# Patient Record
Sex: Female | Born: 1955 | Race: White | Hispanic: No | Marital: Married | Smoking: Former smoker
Health system: Southern US, Community
[De-identification: ages and names within clinical notes are randomized; demographics above are authoritative.]

## PROBLEM LIST (undated history)

## (undated) DIAGNOSIS — E119 Type 2 diabetes mellitus without complications: Secondary | ICD-10-CM

## (undated) DIAGNOSIS — E785 Hyperlipidemia, unspecified: Secondary | ICD-10-CM

## (undated) DIAGNOSIS — I10 Essential (primary) hypertension: Secondary | ICD-10-CM

## (undated) HISTORY — PX: TUBAL LIGATION: SHX77

## (undated) HISTORY — DX: Essential (primary) hypertension: I10

## (undated) HISTORY — DX: Hyperlipidemia, unspecified: E78.5

## (undated) HISTORY — PX: CATARACT EXTRACTION, BILATERAL: SHX1313

## (undated) HISTORY — DX: Type 2 diabetes mellitus without complications: E11.9

## (undated) HISTORY — PX: TONSILLECTOMY: SUR1361

---

## 1999-09-05 ENCOUNTER — Other Ambulatory Visit: Admission: RE | Admit: 1999-09-05 | Discharge: 1999-09-05 | Payer: Self-pay | Admitting: *Deleted

## 2001-01-18 ENCOUNTER — Other Ambulatory Visit: Admission: RE | Admit: 2001-01-18 | Discharge: 2001-01-18 | Payer: Self-pay | Admitting: *Deleted

## 2002-04-04 ENCOUNTER — Other Ambulatory Visit: Admission: RE | Admit: 2002-04-04 | Discharge: 2002-04-04 | Payer: Self-pay | Admitting: Internal Medicine

## 2004-04-03 ENCOUNTER — Encounter: Admission: RE | Admit: 2004-04-03 | Discharge: 2004-04-03 | Payer: Self-pay | Admitting: Internal Medicine

## 2005-02-09 ENCOUNTER — Other Ambulatory Visit: Admission: RE | Admit: 2005-02-09 | Discharge: 2005-02-09 | Payer: Self-pay | Admitting: Endocrinology

## 2006-04-02 ENCOUNTER — Encounter: Admission: RE | Admit: 2006-04-02 | Discharge: 2006-04-02 | Payer: Self-pay | Admitting: Rheumatology

## 2006-11-24 ENCOUNTER — Ambulatory Visit (HOSPITAL_COMMUNITY): Admission: RE | Admit: 2006-11-24 | Discharge: 2006-11-24 | Payer: Self-pay | Admitting: Family Medicine

## 2007-04-29 ENCOUNTER — Encounter: Admission: RE | Admit: 2007-04-29 | Discharge: 2007-04-29 | Payer: Self-pay | Admitting: Specialist

## 2007-07-25 ENCOUNTER — Encounter: Admission: RE | Admit: 2007-07-25 | Discharge: 2007-07-25 | Payer: Self-pay | Admitting: Specialist

## 2007-11-24 ENCOUNTER — Other Ambulatory Visit: Admission: RE | Admit: 2007-11-24 | Discharge: 2007-11-24 | Payer: Self-pay | Admitting: Family Medicine

## 2009-04-10 ENCOUNTER — Other Ambulatory Visit: Admission: RE | Admit: 2009-04-10 | Discharge: 2009-04-10 | Payer: Self-pay | Admitting: Family Medicine

## 2010-04-06 ENCOUNTER — Encounter: Payer: Self-pay | Admitting: General Surgery

## 2012-05-12 ENCOUNTER — Other Ambulatory Visit: Payer: Self-pay

## 2012-06-10 ENCOUNTER — Ambulatory Visit
Admission: RE | Admit: 2012-06-10 | Discharge: 2012-06-10 | Disposition: A | Discharge status: Home / Self Care | Payer: Self-pay | Source: Ambulatory Visit

## 2012-06-10 DIAGNOSIS — Z1231 Encounter for screening mammogram for malignant neoplasm of breast: Secondary | ICD-10-CM

## 2012-12-08 ENCOUNTER — Other Ambulatory Visit (HOSPITAL_COMMUNITY)
Admission: RE | Admit: 2012-12-08 | Discharge: 2012-12-08 | Disposition: A | Discharge status: Home / Self Care | Payer: BC Managed Care – PPO | Source: Ambulatory Visit | Attending: Family Medicine | Admitting: Family Medicine

## 2012-12-08 ENCOUNTER — Other Ambulatory Visit: Payer: Self-pay | Admitting: Family Medicine

## 2012-12-08 DIAGNOSIS — Z0000 Encounter for general adult medical examination without abnormal findings: Secondary | ICD-10-CM | POA: Insufficient documentation

## 2015-05-02 ENCOUNTER — Other Ambulatory Visit: Payer: Self-pay | Admitting: Obstetrics & Gynecology

## 2015-05-02 ENCOUNTER — Other Ambulatory Visit (HOSPITAL_COMMUNITY)
Admission: RE | Admit: 2015-05-02 | Discharge: 2015-05-02 | Disposition: A | Discharge status: Home / Self Care | Payer: BLUE CROSS/BLUE SHIELD | Source: Ambulatory Visit | Attending: Obstetrics & Gynecology | Admitting: Obstetrics & Gynecology

## 2015-05-02 DIAGNOSIS — Z1151 Encounter for screening for human papillomavirus (HPV): Secondary | ICD-10-CM | POA: Insufficient documentation

## 2015-05-02 DIAGNOSIS — Z01419 Encounter for gynecological examination (general) (routine) without abnormal findings: Secondary | ICD-10-CM | POA: Diagnosis present

## 2015-05-06 LAB — CYTOLOGY - PAP

## 2019-01-19 DIAGNOSIS — Z6825 Body mass index (BMI) 25.0-25.9, adult: Secondary | ICD-10-CM | POA: Diagnosis not present

## 2019-01-19 DIAGNOSIS — R202 Paresthesia of skin: Secondary | ICD-10-CM | POA: Diagnosis not present

## 2019-01-19 DIAGNOSIS — Z0000 Encounter for general adult medical examination without abnormal findings: Secondary | ICD-10-CM | POA: Diagnosis not present

## 2019-01-19 DIAGNOSIS — M2550 Pain in unspecified joint: Secondary | ICD-10-CM | POA: Diagnosis not present

## 2019-01-19 DIAGNOSIS — I119 Hypertensive heart disease without heart failure: Secondary | ICD-10-CM | POA: Diagnosis not present

## 2019-01-23 DIAGNOSIS — E119 Type 2 diabetes mellitus without complications: Secondary | ICD-10-CM | POA: Diagnosis not present

## 2019-01-29 DIAGNOSIS — Z882 Allergy status to sulfonamides status: Secondary | ICD-10-CM | POA: Diagnosis not present

## 2019-01-29 DIAGNOSIS — R072 Precordial pain: Secondary | ICD-10-CM | POA: Diagnosis not present

## 2019-01-29 DIAGNOSIS — E119 Type 2 diabetes mellitus without complications: Secondary | ICD-10-CM | POA: Diagnosis not present

## 2019-01-29 DIAGNOSIS — R0789 Other chest pain: Secondary | ICD-10-CM | POA: Diagnosis not present

## 2019-01-29 DIAGNOSIS — Z79899 Other long term (current) drug therapy: Secondary | ICD-10-CM | POA: Diagnosis not present

## 2019-01-29 DIAGNOSIS — I10 Essential (primary) hypertension: Secondary | ICD-10-CM | POA: Diagnosis not present

## 2019-01-29 DIAGNOSIS — R0602 Shortness of breath: Secondary | ICD-10-CM | POA: Diagnosis not present

## 2019-01-29 DIAGNOSIS — F17200 Nicotine dependence, unspecified, uncomplicated: Secondary | ICD-10-CM | POA: Diagnosis not present

## 2019-01-29 DIAGNOSIS — R079 Chest pain, unspecified: Secondary | ICD-10-CM | POA: Diagnosis not present

## 2019-02-03 DIAGNOSIS — E119 Type 2 diabetes mellitus without complications: Secondary | ICD-10-CM | POA: Diagnosis not present

## 2019-02-06 DIAGNOSIS — H2513 Age-related nuclear cataract, bilateral: Secondary | ICD-10-CM | POA: Diagnosis not present

## 2019-02-06 DIAGNOSIS — E119 Type 2 diabetes mellitus without complications: Secondary | ICD-10-CM | POA: Diagnosis not present

## 2019-02-06 DIAGNOSIS — Z794 Long term (current) use of insulin: Secondary | ICD-10-CM | POA: Diagnosis not present

## 2019-02-06 DIAGNOSIS — H25013 Cortical age-related cataract, bilateral: Secondary | ICD-10-CM | POA: Diagnosis not present

## 2019-02-08 ENCOUNTER — Other Ambulatory Visit: Payer: Self-pay

## 2019-02-08 ENCOUNTER — Ambulatory Visit: Payer: Self-pay | Admitting: Cardiology

## 2019-02-08 ENCOUNTER — Encounter: Payer: Self-pay | Admitting: Cardiology

## 2019-02-08 ENCOUNTER — Ambulatory Visit (INDEPENDENT_AMBULATORY_CARE_PROVIDER_SITE_OTHER): Payer: BC Managed Care – PPO | Admitting: Cardiology

## 2019-02-08 VITALS — BP 104/61 | HR 71 | Temp 97.5°F | Ht 64.0 in | Wt 133.6 lb

## 2019-02-08 DIAGNOSIS — R072 Precordial pain: Secondary | ICD-10-CM | POA: Diagnosis not present

## 2019-02-08 DIAGNOSIS — R0989 Other specified symptoms and signs involving the circulatory and respiratory systems: Secondary | ICD-10-CM | POA: Insufficient documentation

## 2019-02-08 DIAGNOSIS — F17219 Nicotine dependence, cigarettes, with unspecified nicotine-induced disorders: Secondary | ICD-10-CM | POA: Diagnosis not present

## 2019-02-08 DIAGNOSIS — R0789 Other chest pain: Secondary | ICD-10-CM

## 2019-02-08 DIAGNOSIS — E783 Hyperchylomicronemia: Secondary | ICD-10-CM | POA: Insufficient documentation

## 2019-02-08 DIAGNOSIS — R002 Palpitations: Secondary | ICD-10-CM | POA: Insufficient documentation

## 2019-02-08 DIAGNOSIS — Z720 Tobacco use: Secondary | ICD-10-CM | POA: Diagnosis not present

## 2019-02-08 DIAGNOSIS — I208 Other forms of angina pectoris: Secondary | ICD-10-CM | POA: Insufficient documentation

## 2019-02-08 MED ORDER — CILOSTAZOL 50 MG PO TABS: 50.0000 mg | ORAL_TABLET | Freq: Two times a day (BID) | ORAL | 3 refills | Status: DC

## 2019-02-08 MED ORDER — METOPROLOL TARTRATE 25 MG PO TABS: 25.0000 mg | ORAL_TABLET | ORAL | 1 refills | Status: DC

## 2019-02-08 MED ORDER — ROSUVASTATIN CALCIUM 10 MG PO TABS: 10.0000 mg | ORAL_TABLET | Freq: Every day | ORAL | 3 refills | Status: DC

## 2019-02-08 MED ORDER — METOPROLOL TARTRATE 25 MG PO TABS
25.0000 mg | ORAL_TABLET | ORAL | 1 refills | Status: DC
Start: 2019-02-08 — End: 2019-02-08

## 2019-02-08 NOTE — Progress Notes (Signed)
Patient referred by Jilda Panda, MD for chest pain  Subjective:   Chloe Garza, female    DOB: [birth year], 63 y.o.   MRN: [id number]   Chief Complaint  Patient presents with  . Abnormal ECG  . Palpitations  . New Patient (Initial Visit)     HPI  63 y.o. Caucasian female with tobacco abuse, newly diagnosed uncontrolled type 2 DM, fibromyalgia, referred for evaluation of chest pain and palpitations.   Patient is a Gaffer, currently not working. She was recently diagnosed with diabetes.  She reports episodes of retrosternal chest pain, which are unrelated to exertion.  She also reports episodes of palpitations that seem to occur "all the time". On further questioning, she reports pain in both her legs, especially on walking. She states that it is difficult for her to tease out her pain symptoms given her fibromyalgia.  She currently smokes 1 to 2 cigarettes a day, and hopes to quit soon.   Past Medical History:  Diagnosis Date  . Diabetes mellitus without complication ([place])   . Hypertension      Past Surgical History:  Procedure Laterality Date  . TONSILLECTOMY    . TUBAL LIGATION      Social History   Tobacco Use  Smoking Status Current Some Day Smoker  Smokeless Tobacco Never Used  Tobacco Comment   1-2 per day , since a teenager       Family History  Problem Relation Age of Onset  . Hypertension Mother   . Stroke Mother   . Alzheimer's disease Mother   . Heart attack Father   . Pneumonia Father   . CAD Sister      Current Outpatient Medications on File Prior to Visit  Medication Sig Dispense Refill  . Azilsartan-Chlorthalidone (EDARBYCLOR) 40-25 MG TABS Take 0.5 mg by mouth daily.    . Insulin Glargine, 2 Unit Dial, (TOUJEO MAX SOLOSTAR) 300 UNIT/ML SOPN Inject 20 Units into the skin daily.    . Potassium 99 MG TABS Take 1 tablet by mouth daily.     No current facility-administered medications on file prior to visit.      Cardiovascular studies:  EKG 02/08/2019: Sinus rhythm 72 bpm. Anteolateral T wave inversion, unchanged compared to outside EKG.   Recent labs: 01/20/2019: Glucose 217.  BUN/creatinine 11/0.64.  eGFR normal.  Sodium 137, potassium 3.9.  Rest of the CMP normal. H/H 14/42.  MCV 87.  Platelets 240. Cholesterol 218, triglycerides 293, HDL 44, LDL 114 Hemoglobin A1c 12.1%. ANA screen positive.  Review of Systems  Constitution: Negative for decreased appetite, malaise/fatigue, weight gain and weight loss.  HENT: Negative for congestion.   Eyes: Negative for visual disturbance.  Cardiovascular: Positive for chest pain. Negative for dyspnea on exertion, leg swelling, palpitations and syncope.  Respiratory: Negative for cough.   Endocrine: Negative for cold intolerance.  Hematologic/Lymphatic: Does not bruise/bleed easily.  Skin: Negative for itching and rash.  Musculoskeletal: Negative for myalgias.  Gastrointestinal: Negative for abdominal pain, nausea and vomiting.  Genitourinary: Negative for dysuria.  Neurological: Negative for dizziness and weakness.  Psychiatric/Behavioral: The patient is not nervous/anxious.   All other systems reviewed and are negative.        Vitals:   02/08/19 0855  BP: 104/61  Pulse: 71  Temp: (!) 97.5 F (36.4 C)  SpO2: 95%     Body mass index is 22.93 kg/m. Filed Weights   02/08/19 0855  Weight: 133 lb 9.6 oz (60.6 kg)  Objective:   Physical Exam  Constitutional: She is oriented to person, place, and time. She appears well-developed and well-nourished. No distress.  HENT:  Head: Normocephalic and atraumatic.  Eyes: Pupils are equal, round, and reactive to light. Conjunctivae are normal.  Neck: No JVD present.  Cardiovascular: Normal rate and regular rhythm.  No murmur heard. Pulses:      Femoral pulses are 2+ on the right side and 2+ on the left side.      Popliteal pulses are 1+ on the right side and 0 on the left side.        Dorsalis pedis pulses are 0 on the right side and 0 on the left side.       Posterior tibial pulses are 0 on the right side and 0 on the left side.  Cyanotic appearing  b/l LE toes without any wounds/ulcers.  Pulmonary/Chest: Effort normal and breath sounds normal. She has no wheezes. She has no rales.  Abdominal: Soft. Bowel sounds are normal. There is no rebound.  Musculoskeletal:        General: No edema.  Lymphadenopathy:    She has no cervical adenopathy.  Neurological: She is alert and oriented to person, place, and time. No cranial nerve deficit.  Skin: Skin is warm and dry.  Psychiatric: She has a normal mood and affect.  Nursing note and vitals reviewed.         Assessment & Recommendations:   62 y.o. Caucasian female with tobacco abuse, newly diagnosed uncontrolled type 2 DM, fibromyalgia, referred for evaluation of chest pain and palpitations.   Chest pain: Atypical.  However, she has risk factors with tobacco abuse and uncontrolled type 2 diabetes mellitus.  I will obtain CT coronary angiogram for definitive coronary anatomy evaluation.  Palpitations: Symptoms on a regular basis, sometimes associated with chest pain.  Will obtain echocardiogram to evaluate structural abnormalities, sinus event monitor to evaluate for any arrhythmias.  Suspected peripheral artery disease: Absent extremity pulses with cyanotic extremities, without any ulcer.  I started her on aspirin and Pletal, switch atorvastatin to rosuvastatin 10 mg daily.  I will obtain baseline ABI.  Tobacco cessation counseling:  - Currently smoking 1-2 cigarettes/day   - Patient was informed of the dangers of tobacco abuse including stroke, cancer, and MI, as well as benefits of tobacco cessation. - Patient is willing to quit at this time. - Approximately 5 mins were spent counseling patient cessation techniques. We discussed various methods to help quit smoking, including deciding on a date to quit, joining a  support group, pharmacological agents. Patient would like to quit on her own.. - I will reassess her progress at the next follow-up visit   Continue management of DM as per Dr. Mellody Drown   Thank you for referring the patient to [place]. Please feel free to contact with any questions.  Nigel Mormon, MD [facility] Pager: [phone] Office: [phone]

## 2019-02-08 NOTE — Patient Instructions (Addendum)
Your cardiac CT will be scheduled at the locations below:   [facility]  [address]  , [place] [address]  [phone]    If scheduled at [facility], please arrive at the [facility] main entrance of [facility] 30-45 minutes prior to test start time.  Proceed to the [facility] Department (first floor) to check-in and test prep.   Please follow these instructions carefully (unless otherwise directed):   On the Night Before the Test:   Be sure to Drink plenty of water.   Do not consume any caffeinated/decaffeinated beverages or chocolate 12 hours prior to your test.   Do not take any antihistamines 12 hours prior to your test.   If the patient has contrast allergy:  Patient will need a prescription for Prednisone and very clear instructions (as follows):  1. Prednisone 50 mg - take 13 hours prior to test  2. Take another Prednisone 50 mg 7 hours prior to test  3. Take another Prednisone 50 mg 1 hour prior to test  4. Take Benadryl 50 mg 1 hour prior to test   Patient must complete all four doses of above prophylactic medications.   Patient will need a ride after test due to Benadryl.   On the Day of the Test:   Drink plenty of water. Do not drink any water within one hour of the test.   Do not eat any food 4 hours prior to the test.   You may take your regular medications prior to the test.   - Metoprolol tartarate   Start 2 days before CT scan.    Take a dose 2 hour before the CT scan   Take the remaining pills with you.   You may stop it after the CT scan, unless specified otherwise by me.    FEMALES- please wear underwire-free bra if available          After the Test:   Drink plenty of water.   After receiving IV contrast, you may experience a mild flushed feeling. This is normal.   On occasion, you may experience a mild rash up to 24 hours after the test. This is not dangerous. If this occurs, you can take  Benadryl 25 mg and increase your fluid intake.   If you experience trouble breathing, this can be serious. If it is severe call 911 IMMEDIATELY. If it is mild, please call our office.   If you take any of these medications: Glipizide/Metformin, Avandament, Glucavance, please do not take 48 hours after completing test unless otherwise instructed.     Please contact the cardiac imaging nurse navigator should you have any questions/concerns  Sara Wallace, RN Navigator Cardiac Imaging  [facility]  [phone] Office  [phone] Cell   

## 2019-02-13 ENCOUNTER — Other Ambulatory Visit: Payer: Self-pay

## 2019-02-13 ENCOUNTER — Telehealth: Payer: Self-pay

## 2019-02-13 DIAGNOSIS — R0989 Other specified symptoms and signs involving the circulatory and respiratory systems: Secondary | ICD-10-CM

## 2019-02-13 DIAGNOSIS — E783 Hyperchylomicronemia: Secondary | ICD-10-CM

## 2019-02-13 DIAGNOSIS — R0789 Other chest pain: Secondary | ICD-10-CM

## 2019-02-13 MED ORDER — CILOSTAZOL 50 MG PO TABS: 50.0000 mg | ORAL_TABLET | Freq: Two times a day (BID) | ORAL | 2 refills | Status: DC

## 2019-02-13 MED ORDER — ROSUVASTATIN CALCIUM 10 MG PO TABS: 10.0000 mg | ORAL_TABLET | Freq: Every day | ORAL | 3 refills | Status: DC

## 2019-02-13 MED ORDER — METOPROLOL TARTRATE 25 MG PO TABS: 25.0000 mg | ORAL_TABLET | ORAL | 1 refills | Status: DC

## 2019-02-13 MED ORDER — EDARBYCLOR 40-25 MG PO TABS: 0.5000 mg | ORAL_TABLET | Freq: Every day | ORAL | 1 refills | Status: DC

## 2019-02-13 NOTE — Telephone Encounter (Signed)
sent 

## 2019-02-13 NOTE — Telephone Encounter (Signed)
Can you please take care of this?

## 2019-02-14 ENCOUNTER — Other Ambulatory Visit: Payer: Self-pay

## 2019-02-14 DIAGNOSIS — R0789 Other chest pain: Secondary | ICD-10-CM

## 2019-02-14 MED ORDER — METOPROLOL TARTRATE 25 MG PO TABS: 25.0000 mg | ORAL_TABLET | ORAL | 1 refills | Status: DC

## 2019-02-14 MED ORDER — METOPROLOL TARTRATE 25 MG PO TABS: 25.0000 mg | ORAL_TABLET | Freq: Two times a day (BID) | ORAL | 1 refills | Status: DC

## 2019-02-15 ENCOUNTER — Ambulatory Visit: Payer: Self-pay | Admitting: Cardiology

## 2019-02-17 ENCOUNTER — Ambulatory Visit (INDEPENDENT_AMBULATORY_CARE_PROVIDER_SITE_OTHER): Payer: BC Managed Care – PPO

## 2019-02-17 ENCOUNTER — Other Ambulatory Visit: Payer: Self-pay

## 2019-02-17 ENCOUNTER — Other Ambulatory Visit: Payer: BC Managed Care – PPO

## 2019-02-17 ENCOUNTER — Ambulatory Visit: Payer: BC Managed Care – PPO

## 2019-02-17 DIAGNOSIS — R002 Palpitations: Secondary | ICD-10-CM

## 2019-02-17 DIAGNOSIS — R0989 Other specified symptoms and signs involving the circulatory and respiratory systems: Secondary | ICD-10-CM

## 2019-02-17 DIAGNOSIS — I119 Hypertensive heart disease without heart failure: Secondary | ICD-10-CM | POA: Diagnosis not present

## 2019-02-17 DIAGNOSIS — E7800 Pure hypercholesterolemia, unspecified: Secondary | ICD-10-CM | POA: Diagnosis not present

## 2019-02-17 DIAGNOSIS — E119 Type 2 diabetes mellitus without complications: Secondary | ICD-10-CM | POA: Diagnosis not present

## 2019-02-20 NOTE — Progress Notes (Signed)
If she has palpitations, then yes. Otherwise, okay to hold.  Thanks MJP

## 2019-02-20 NOTE — Progress Notes (Signed)
Pt is aware.  

## 2019-02-20 NOTE — Progress Notes (Signed)
Called patient regarding her echo results. Patient is aware to continue all current medications, but did want to know if she needs to restart her Metoprolol? Please advise.

## 2019-02-21 DIAGNOSIS — H25813 Combined forms of age-related cataract, bilateral: Secondary | ICD-10-CM | POA: Diagnosis not present

## 2019-02-22 DIAGNOSIS — H25011 Cortical age-related cataract, right eye: Secondary | ICD-10-CM | POA: Diagnosis not present

## 2019-02-22 DIAGNOSIS — H2511 Age-related nuclear cataract, right eye: Secondary | ICD-10-CM | POA: Diagnosis not present

## 2019-02-24 ENCOUNTER — Ambulatory Visit: Payer: BC Managed Care – PPO | Admitting: Cardiology

## 2019-03-01 DIAGNOSIS — H2511 Age-related nuclear cataract, right eye: Secondary | ICD-10-CM | POA: Diagnosis not present

## 2019-03-01 DIAGNOSIS — H25812 Combined forms of age-related cataract, left eye: Secondary | ICD-10-CM | POA: Diagnosis not present

## 2019-03-08 DIAGNOSIS — H2512 Age-related nuclear cataract, left eye: Secondary | ICD-10-CM | POA: Diagnosis not present

## 2019-03-08 DIAGNOSIS — H25012 Cortical age-related cataract, left eye: Secondary | ICD-10-CM | POA: Diagnosis not present

## 2019-03-08 NOTE — Progress Notes (Signed)
Follow up visit  Subjective:   Chloe Garza, female    DOB: 05-Oct-1955, 63 y.o.   MRN: [id number]    HPI   63 y.o. Caucasian female with tobacco abuse, newly diagnosed uncontrolled type 2 DM, fibromyalgia, referred for evaluation of chest pain and palpitations.   She did not undergo CTA as recommended.  It appears that it is scheduled for February.  Event monitor did not show any abnormalities. ABI showed mildly abnormal biphasic waveform at the level of bilateral ankles.  Her symptoms of chest pain and palpitations have completely resolved on metoprolol.  She is taking cilostazol once a day instead of twice a day, due to dizziness.  Her claudication has improved, although not resolved yet.  She is off statin for 2 weeks.  She has had muscle aches with or without statin, does not clear if statin is the cause.  Diabetes is now better controlled.  She is down to 1 cigarette a day and hopes to quit soon.   Current Outpatient Medications on File Prior to Visit  Medication Sig Dispense Refill  . aspirin EC 81 MG tablet Take 81 mg by mouth as needed.     . Azilsartan-Chlorthalidone (EDARBYCLOR) 40-25 MG TABS Take 0.5 mg by mouth daily. 30 tablet 1  . cilostazol (PLETAL) 50 MG tablet Take 1 tablet (50 mg total) by mouth 2 (two) times daily. 60 tablet 2  . Insulin Glargine, 2 Unit Dial, (TOUJEO MAX SOLOSTAR) 300 UNIT/ML SOPN Inject 20 Units into the skin daily.    . metoprolol tartrate (LOPRESSOR) 25 MG tablet Take 1 tablet (25 mg total) by mouth 2 (two) times daily. 30 tablet 1  . naproxen sodium (ALEVE) 220 MG tablet Take 220 mg by mouth daily as needed.    . Potassium 99 MG TABS Take 1 tablet by mouth daily.    . rosuvastatin (CRESTOR) 10 MG tablet Take 1 tablet (10 mg total) by mouth at bedtime. 30 tablet 3   No current facility-administered medications on file prior to visit.    Cardiovascular & other pertient studies:  Event monitor 02/17/2019 - 03/02/2019: Diagnostic time: 94%   Dominant rhythm: Sinus. HR 56-117 bpm. Avg HR 75 bpm. No atrial fibrillation/atrial flutter/SVT/VT/high grade AV block, sinus pause >3sec noted. Symptoms reported: None  ABI 02/17/2019: This exam reveals normal perfusion of the right lower extremity (ABI). This exam reveals normal perfusion of the left lower extremity (ABI).  Mildly abnormal biphasic waveform at the level of bilateral ankles.   EKG 02/08/2019: Sinus rhythm 72 bpm. Anteolateral T wave inversion, unchanged compared to outside EKG.   Recent labs: 01/20/2019: Glucose 217.  BUN/creatinine 11/0.64.  eGFR normal.  Sodium 137, potassium 3.9.  Rest of the CMP normal. H/H 14/42.  MCV 87.  Platelets 240. Cholesterol 218, triglycerides 293, HDL 44, LDL 114 Hemoglobin A1c 12.1%. ANA screen positive.   Review of Systems  Constitution: Negative for decreased appetite, malaise/fatigue, weight gain and weight loss.  HENT: Negative for congestion.   Eyes: Negative for visual disturbance.  Cardiovascular: Positive for claudication (Improved). Negative for chest pain, dyspnea on exertion, leg swelling, palpitations and syncope.  Respiratory: Negative for cough.   Endocrine: Negative for cold intolerance.  Hematologic/Lymphatic: Does not bruise/bleed easily.  Skin: Negative for itching and rash.  Musculoskeletal: Negative for myalgias.  Gastrointestinal: Negative for abdominal pain, nausea and vomiting.  Genitourinary: Negative for dysuria.  Neurological: Negative for dizziness and weakness.  Psychiatric/Behavioral: The patient is not nervous/anxious.  All other systems reviewed and are negative.        Vitals:   03/16/19 1016  BP: 129/73  Pulse: 65  Resp: 16  Temp: 98 F (36.7 C)  SpO2: 98%     Body mass index is 23.52 kg/m. Filed Weights   03/16/19 1016  Weight: 137 lb (62.1 kg)     Objective:   Physical Exam  Constitutional: She is oriented to person, place, and time. She appears well-developed and  well-nourished. No distress.  HENT:  Head: Normocephalic and atraumatic.  Eyes: Pupils are equal, round, and reactive to light. Conjunctivae are normal.  Neck: No JVD present.  Cardiovascular: Normal rate and regular rhythm.  No murmur heard. Pulses:      Dorsalis pedis pulses are 0 on the right side and 0 on the left side.       Posterior tibial pulses are 1+ on the right side and 1+ on the left side.  Pulmonary/Chest: Effort normal and breath sounds normal. She has no wheezes. She has no rales.  Abdominal: Soft. Bowel sounds are normal. There is no rebound.  Musculoskeletal:        General: No edema.  Lymphadenopathy:    She has no cervical adenopathy.  Neurological: She is alert and oriented to person, place, and time. No cranial nerve deficit.  Skin: Skin is warm and dry.  Psychiatric: She has a normal mood and affect.  Nursing note and vitals reviewed.       Assessment & Recommendations:   63 y.o. Caucasian female with type 2 DM, prior tobacco dependence, claudication, chest pain, palpitations  Chest pain, palpitations: Resolved on metoprolol.  Okay to hold off any ischemia, or coronary anatomy work-up at this time.  Continue aspirin and statin.  Claudication: He likely has mild PAD.  Continue cilostazol as tolerated.  Encourage regular walking.  Follow-up in 3 months.  Type 2 diabetes mellitus: Continue follow-up with PCP.  History of tobacco abuse: She is down to 1 cigarette and hopes to quit soon.  I congratulated and further encouraged the patient.    Nigel Mormon, MD [facility] Pager: [phone] Office: [phone]

## 2019-03-16 ENCOUNTER — Telehealth: Payer: Self-pay

## 2019-03-16 ENCOUNTER — Other Ambulatory Visit: Payer: Self-pay

## 2019-03-16 ENCOUNTER — Encounter: Payer: Self-pay | Admitting: Cardiology

## 2019-03-16 ENCOUNTER — Ambulatory Visit (INDEPENDENT_AMBULATORY_CARE_PROVIDER_SITE_OTHER): Payer: BC Managed Care – PPO | Admitting: Cardiology

## 2019-03-16 VITALS — BP 129/73 | HR 65 | Temp 98.0°F | Resp 16 | Ht 64.0 in | Wt 137.0 lb

## 2019-03-16 DIAGNOSIS — R002 Palpitations: Secondary | ICD-10-CM | POA: Diagnosis not present

## 2019-03-16 DIAGNOSIS — Z720 Tobacco use: Secondary | ICD-10-CM

## 2019-03-16 DIAGNOSIS — E783 Hyperchylomicronemia: Secondary | ICD-10-CM | POA: Diagnosis not present

## 2019-03-16 DIAGNOSIS — R0789 Other chest pain: Secondary | ICD-10-CM

## 2019-03-16 DIAGNOSIS — I739 Peripheral vascular disease, unspecified: Secondary | ICD-10-CM

## 2019-04-13 ENCOUNTER — Other Ambulatory Visit: Payer: Self-pay | Admitting: Cardiology

## 2019-04-13 DIAGNOSIS — R0789 Other chest pain: Secondary | ICD-10-CM

## 2019-04-21 ENCOUNTER — Ambulatory Visit (HOSPITAL_COMMUNITY): Payer: BLUE CROSS/BLUE SHIELD

## 2019-04-21 DIAGNOSIS — E7800 Pure hypercholesterolemia, unspecified: Secondary | ICD-10-CM | POA: Diagnosis not present

## 2019-04-21 DIAGNOSIS — I119 Hypertensive heart disease without heart failure: Secondary | ICD-10-CM | POA: Diagnosis not present

## 2019-04-21 DIAGNOSIS — E119 Type 2 diabetes mellitus without complications: Secondary | ICD-10-CM | POA: Diagnosis not present

## 2019-05-12 ENCOUNTER — Other Ambulatory Visit: Payer: Self-pay | Admitting: Cardiology

## 2019-05-12 DIAGNOSIS — R0989 Other specified symptoms and signs involving the circulatory and respiratory systems: Secondary | ICD-10-CM

## 2019-06-12 ENCOUNTER — Other Ambulatory Visit: Payer: Self-pay

## 2019-06-12 ENCOUNTER — Encounter: Payer: Self-pay | Admitting: Cardiology

## 2019-06-12 ENCOUNTER — Ambulatory Visit: Payer: BC Managed Care – PPO | Admitting: Cardiology

## 2019-06-12 VITALS — BP 122/60 | HR 59 | Temp 98.4°F | Resp 17 | Ht 64.0 in | Wt 140.0 lb

## 2019-06-12 DIAGNOSIS — Z8249 Family history of ischemic heart disease and other diseases of the circulatory system: Secondary | ICD-10-CM

## 2019-06-12 DIAGNOSIS — R0789 Other chest pain: Secondary | ICD-10-CM

## 2019-06-12 DIAGNOSIS — R079 Chest pain, unspecified: Secondary | ICD-10-CM | POA: Diagnosis not present

## 2019-06-12 DIAGNOSIS — R9431 Abnormal electrocardiogram [ECG] [EKG]: Secondary | ICD-10-CM | POA: Diagnosis not present

## 2019-06-12 DIAGNOSIS — R002 Palpitations: Secondary | ICD-10-CM

## 2019-06-12 MED ORDER — METOPROLOL TARTRATE 25 MG PO TABS: 25.0000 mg | ORAL_TABLET | Freq: Two times a day (BID) | ORAL | 3 refills | Status: DC

## 2019-06-12 NOTE — Progress Notes (Signed)
Follow up visit  Subjective:   Chloe Garza, female    DOB: 1956/03/11, 64 y.o.   MRN: [id number]    HPI   64 y.o. Caucasian female with type 2 DM, prior tobacco dependence, claudication, chest pain, palpitations  Palpitations symptoms have recurred, still remain infrequent. Prior workup did not show any arrhythmia., She has also noticed left sided chest pain on certain heavy lifting activity, relieved with rest. She tells me that her BP and DM are much better controlled. I do not have recent A1C results, but around 7% according to the patient.,    Current Outpatient Medications on File Prior to Visit  Medication Sig Dispense Refill   aspirin EC 81 MG tablet Take 81 mg by mouth as needed.      Azilsartan-Chlorthalidone (EDARBYCLOR) 40-25 MG TABS Take 0.5 mg by mouth daily. 30 tablet 1   cilostazol (PLETAL) 50 MG tablet TAKE 1 TABLET(50 MG) BY MOUTH TWICE DAILY 60 tablet 2   Insulin Glargine, 2 Unit Dial, (TOUJEO MAX SOLOSTAR) 300 UNIT/ML SOPN Inject 20 Units into the skin daily.     metoprolol tartrate (LOPRESSOR) 25 MG tablet TAKE 1 TABLET BY MOUTH AS DIRECTED 90 tablet 3   Potassium 99 MG TABS Take 1 tablet by mouth daily.     rosuvastatin (CRESTOR) 10 MG tablet Take 1 tablet (10 mg total) by mouth at bedtime. (Patient not taking: Reported on 03/16/2019) 30 tablet 3   No current facility-administered medications on file prior to visit.    Cardiovascular & other pertient studies:  EKG 06/12/2019: Sinus bradycardia 50 bpm. Anterolateral T wave inversion, cannot exclude ischemia.  Event monitor 02/17/2019 - 03/02/2019: Diagnostic time: 94%  Dominant rhythm: Sinus. HR 56-117 bpm. Avg HR 75 bpm. No atrial fibrillation/atrial flutter/SVT/VT/high grade AV block, sinus pause >3sec noted. Symptoms reported: None  ABI 02/17/2019: This exam reveals normal perfusion of the right lower extremity (ABI). This exam reveals normal perfusion of the left lower extremity (ABI).   Mildly abnormal biphasic waveform at the level of bilateral ankles.   EKG 02/08/2019: Sinus rhythm 72 bpm. Anteolateral T wave inversion, unchanged compared to outside EKG.   Recent labs: 01/20/2019: Glucose 217.  BUN/creatinine 11/0.64.  eGFR normal.  Sodium 137, potassium 3.9.  Rest of the CMP normal. H/H 14/42.  MCV 87.  Platelets 240. Cholesterol 218, triglycerides 293, HDL 44, LDL 114 Hemoglobin A1c 12.1%. ANA screen positive.   Review of Systems  Cardiovascular: Positive for chest pain and claudication (Improved). Negative for dyspnea on exertion, leg swelling, palpitations and syncope.         Vitals:   06/12/19 1139  BP: 122/60  Pulse: (!) 59  Resp: 17  Temp: 98.4 F (36.9 C)  SpO2: 99%     Body mass index is 24.03 kg/m. Filed Weights   06/12/19 1139  Weight: 140 lb (63.5 kg)     Objective:   Physical Exam  Constitutional: She appears well-developed and well-nourished.  Neck: No JVD present.  Cardiovascular: Normal rate, regular rhythm and normal heart sounds. Exam reveals decreased pulses.  No murmur heard. Pulmonary/Chest: Effort normal and breath sounds normal. She has no wheezes. She has no rales.  Musculoskeletal:        General: No edema.  Nursing note and vitals reviewed.       Assessment & Recommendations:   64 y.o. Caucasian female with type 2 DM, prior tobacco dependence, claudication, chest pain, palpitations  Chest pain, palpitations: Increase metoprolol tartarate to 25  mg bid. Will obtain lexiscan nuclear stress test and calcium score scan (abnormal baseline EKG) Continue aspirin and statin.  Claudication: She likely has mild PAD, without any lifestyle limiting symptoms. Currently not taking cilostazol.  Type 2 diabetes mellitus: Continue follow-up with PCP.  F/u in 6-8 weeks  Nigel Mormon, MD [facility] Pager: [phone] Office: [phone]

## 2019-06-15 ENCOUNTER — Other Ambulatory Visit: Payer: Self-pay | Admitting: Cardiology

## 2019-06-15 DIAGNOSIS — E783 Hyperchylomicronemia: Secondary | ICD-10-CM

## 2019-06-21 ENCOUNTER — Ambulatory Visit: Payer: BC Managed Care – PPO

## 2019-06-21 ENCOUNTER — Other Ambulatory Visit: Payer: Self-pay

## 2019-06-21 DIAGNOSIS — R079 Chest pain, unspecified: Secondary | ICD-10-CM

## 2019-06-21 DIAGNOSIS — R9431 Abnormal electrocardiogram [ECG] [EKG]: Secondary | ICD-10-CM | POA: Diagnosis not present

## 2019-06-22 NOTE — Progress Notes (Signed)
Personally reviewed and dicussed with the patient. I do think there inferoseptal ischemia. Calcium score is >90th percentile, including left main calcium. Recommend OV next week to further discuss results and discuss need for invasive evaluation.  Elder Negus, MD

## 2019-06-27 DIAGNOSIS — I25118 Atherosclerotic heart disease of native coronary artery with other forms of angina pectoris: Secondary | ICD-10-CM | POA: Insufficient documentation

## 2019-06-27 DIAGNOSIS — I739 Peripheral vascular disease, unspecified: Secondary | ICD-10-CM | POA: Insufficient documentation

## 2019-06-27 DIAGNOSIS — E1165 Type 2 diabetes mellitus with hyperglycemia: Secondary | ICD-10-CM | POA: Insufficient documentation

## 2019-06-27 NOTE — Progress Notes (Signed)
Follow up visit  Subjective:   Chloe Garza, female    DOB: [birth year], 64 y.o.   MRN: [id number]    HPI   64 y.o. Caucasian female with type 2 DM, prior tobacco dependence, CAD, PAD  While stress test was reported to have no ischemia, she has extensive calcification throughout coronary arteries. On my personal review, there is mild inferoseptal ischemia.   Patient continues to have episodes of dull chest pain, which are more prominent on exertion.  Discussed test results with the patient.    Current Outpatient Medications on File Prior to Visit  Medication Sig Dispense Refill  . Ascorbic Acid (VITAMIN C) 100 MG tablet Take 250 mg by mouth daily.    Marland Kitchen aspirin EC 81 MG tablet Take 81 mg by mouth as needed.     . Azilsartan-Chlorthalidone (EDARBYCLOR) 40-25 MG TABS Take 0.5 mg by mouth daily. 30 tablet 1  . Cholecalciferol (VITAMIN D) 50 MCG (2000 UT) CAPS Take by mouth.    . Insulin Glargine, 2 Unit Dial, (TOUJEO MAX SOLOSTAR) 300 UNIT/ML SOPN Inject 20 Units into the skin daily.    . metoprolol tartrate (LOPRESSOR) 25 MG tablet Take 1 tablet (25 mg total) by mouth 2 (two) times daily. 60 tablet 3  . Potassium 99 MG TABS Take 1 tablet by mouth daily.    . rosuvastatin (CRESTOR) 10 MG tablet TAKE 1 TABLET(10 MG) BY MOUTH AT BEDTIME 30 tablet 3  . Turmeric (QC TUMERIC COMPLEX PO) Take 1 tablet by mouth daily.    . Vitamin D, Cholecalciferol, 10 MCG (400 UNIT) CHEW Chew 1 tablet by mouth daily.    . Zinc 100 MG TABS Take 1 tablet by mouth daily.     No current facility-administered medications on file prior to visit.    Cardiovascular & other pertient studies:  CT cardiac scoring 06/21/2019: Extensive calcified coronary artery plaque putting the patient above the 90th percentile for age. CALCIUM SCORE: 871. LM: 136. LAD: 167. Cx: 95. RCA: 016 PDA: 0 Visible lung fields: Granulomas are present in the anterior upper lobes.  Lexiscan (Walking with Jaci Carrel) Sestamibi  Stress Test04/09/2019: Nondiagnostic ECG stress. Resting EKG/ECG demonstrated normal sinus rhythm, left ventricular hypertrophy with secondary ST-T changes, repolarization abnormality. Peak EKG/ECG revealed no significant ST-T change from baseline abnormality. Myocardial perfusion is normal. Stress LV EF: 76%.  No previous exam available for comparison. Low risk study.    EKG 06/12/2019: Sinus bradycardia 50 bpm. Anterolateral T wave inversion, cannot exclude ischemia.  Event monitor 02/17/2019 - 03/02/2019: Diagnostic time: 94%  Dominant rhythm: Sinus. HR 56-117 bpm. Avg HR 75 bpm. No atrial fibrillation/atrial flutter/SVT/VT/high grade AV block, sinus pause >3sec noted. Symptoms reported: None  Echocardiogram 02/17/2019:  Left ventricle cavity is normal in size. Mild concentric hypertrophy of  the left ventricle. Normal LV systolic function with EF 55%. Normal global  wall motion. Doppler evidence of grade I (impaired) diastolic dysfunction,  normal LAP.  No significant valvular abnormality.  No evidence of pulmonary hypertension.  ABI 02/17/2019: This exam reveals normal perfusion of the right lower extremity (ABI). This exam reveals normal perfusion of the left lower extremity (ABI).   Mildly abnormal biphasic waveform at the level of bilateral ankles.   Recent labs: 01/20/2019: Glucose 217.  BUN/creatinine 11/0.64.  eGFR normal.  Sodium 137, potassium 3.9.  Rest of the CMP normal. H/H 14/42.  MCV 87.  Platelets 240. Cholesterol 218, triglycerides 293, HDL 44, LDL 114 Hemoglobin A1c 12.1%. ANA screen  positive.   Review of Systems  Cardiovascular: Positive for chest pain and claudication (Improved). Negative for dyspnea on exertion, leg swelling, palpitations and syncope.         Vitals:   06/28/19 1012  BP: 116/64  Pulse: (!) 58  Resp: 18  Temp: 97.6 F (36.4 C)  SpO2: 99%     Body mass index is 24.89 kg/m. Filed Weights   06/28/19 1012  Weight: 145 lb  (65.8 kg)     Objective:   Physical Exam  Constitutional: She appears well-developed and well-nourished.  Neck: No JVD present.  Cardiovascular: Normal rate, regular rhythm and normal heart sounds. Exam reveals decreased pulses.  No murmur heard. Pulmonary/Chest: Effort normal and breath sounds normal. She has no wheezes. She has no rales.  Musculoskeletal:        General: No edema.  Nursing note and vitals reviewed.       Assessment & Recommendations:   64 y.o. Caucasian female with type 2 DM, prior tobacco dependence, CAD, PAD  CAD: Extensive calcification on CT cardiac scoring, including 136 in LM. Mild inferoseptal ischemia on my review.  Continue Aspirin, statin, metoprolol tartarate 25 mg bid. Further up titration of medical therapy is limited due to her low normal blood pressure.  Her continued symptoms, recommend coronary angiography and possible intervention.  We will schedule for 07/06/2019 afternoon.  Claudication: She likely has mild PAD, without any lifestyle limiting symptoms. Currently not taking cilostazol.  Type 2 diabetes mellitus: Continue follow-up with PCP.    Nigel Mormon, MD [facility] Pager: [phone] Office: [phone]

## 2019-06-28 ENCOUNTER — Other Ambulatory Visit: Payer: Self-pay

## 2019-06-28 ENCOUNTER — Encounter: Payer: Self-pay | Admitting: Cardiology

## 2019-06-28 ENCOUNTER — Ambulatory Visit: Payer: BC Managed Care – PPO | Admitting: Cardiology

## 2019-06-28 VITALS — BP 116/64 | HR 58 | Temp 97.6°F | Resp 18 | Ht 64.0 in | Wt 145.0 lb

## 2019-06-28 DIAGNOSIS — I739 Peripheral vascular disease, unspecified: Secondary | ICD-10-CM | POA: Diagnosis not present

## 2019-06-28 DIAGNOSIS — I25118 Atherosclerotic heart disease of native coronary artery with other forms of angina pectoris: Secondary | ICD-10-CM | POA: Diagnosis not present

## 2019-06-28 DIAGNOSIS — E1165 Type 2 diabetes mellitus with hyperglycemia: Secondary | ICD-10-CM

## 2019-07-03 ENCOUNTER — Other Ambulatory Visit (HOSPITAL_COMMUNITY)
Admission: RE | Admit: 2019-07-03 | Discharge: 2019-07-03 | Disposition: A | Discharge status: Home / Self Care | Payer: BC Managed Care – PPO | Source: Ambulatory Visit | Attending: Cardiology | Admitting: Cardiology

## 2019-07-03 DIAGNOSIS — Q245 Malformation of coronary vessels: Secondary | ICD-10-CM | POA: Diagnosis not present

## 2019-07-03 DIAGNOSIS — Z9861 Coronary angioplasty status: Secondary | ICD-10-CM | POA: Diagnosis not present

## 2019-07-03 DIAGNOSIS — Z20822 Contact with and (suspected) exposure to covid-19: Secondary | ICD-10-CM | POA: Diagnosis not present

## 2019-07-03 DIAGNOSIS — Z7982 Long term (current) use of aspirin: Secondary | ICD-10-CM | POA: Diagnosis not present

## 2019-07-03 DIAGNOSIS — Z882 Allergy status to sulfonamides status: Secondary | ICD-10-CM | POA: Diagnosis not present

## 2019-07-03 DIAGNOSIS — I10 Essential (primary) hypertension: Secondary | ICD-10-CM | POA: Diagnosis not present

## 2019-07-03 DIAGNOSIS — I25118 Atherosclerotic heart disease of native coronary artery with other forms of angina pectoris: Secondary | ICD-10-CM | POA: Diagnosis not present

## 2019-07-03 DIAGNOSIS — Z87891 Personal history of nicotine dependence: Secondary | ICD-10-CM | POA: Diagnosis not present

## 2019-07-03 DIAGNOSIS — Z955 Presence of coronary angioplasty implant and graft: Secondary | ICD-10-CM | POA: Diagnosis not present

## 2019-07-03 DIAGNOSIS — Z01812 Encounter for preprocedural laboratory examination: Secondary | ICD-10-CM | POA: Insufficient documentation

## 2019-07-03 DIAGNOSIS — Z794 Long term (current) use of insulin: Secondary | ICD-10-CM | POA: Diagnosis not present

## 2019-07-03 DIAGNOSIS — Z79899 Other long term (current) drug therapy: Secondary | ICD-10-CM | POA: Diagnosis not present

## 2019-07-03 DIAGNOSIS — E1151 Type 2 diabetes mellitus with diabetic peripheral angiopathy without gangrene: Secondary | ICD-10-CM | POA: Diagnosis not present

## 2019-07-03 DIAGNOSIS — Z7902 Long term (current) use of antithrombotics/antiplatelets: Secondary | ICD-10-CM | POA: Diagnosis not present

## 2019-07-03 DIAGNOSIS — Z791 Long term (current) use of non-steroidal anti-inflammatories (NSAID): Secondary | ICD-10-CM | POA: Diagnosis not present

## 2019-07-03 LAB — SARS CORONAVIRUS 2 (TAT 6-24 HRS): SARS Coronavirus 2: NEGATIVE

## 2019-07-04 LAB — CBC
Hematocrit: 37.6 % (ref 34.0–46.6)
Hemoglobin: 12.7 g/dL (ref 11.1–15.9)
MCH: 31.1 pg (ref 26.6–33.0)
MCHC: 33.8 g/dL (ref 31.5–35.7)
MCV: 92 fL (ref 79–97)
Platelets: 361 10*3/uL (ref 150–450)
RBC: 4.09 x10E6/uL (ref 3.77–5.28)
RDW: 12.4 % (ref 11.7–15.4)
WBC: 9.7 10*3/uL (ref 3.4–10.8)

## 2019-07-06 ENCOUNTER — Inpatient Hospital Stay (HOSPITAL_COMMUNITY)
Admission: AD | Admit: 2019-07-06 | Discharge: 2019-07-07 | DRG: 247 | Disposition: A | Discharge status: Home / Self Care | Payer: BC Managed Care – PPO | Attending: Cardiology | Admitting: Cardiology

## 2019-07-06 ENCOUNTER — Other Ambulatory Visit: Payer: Self-pay

## 2019-07-06 ENCOUNTER — Encounter (HOSPITAL_COMMUNITY)
Admission: AD | Disposition: A | Discharge status: Home / Self Care | Payer: Self-pay | Source: Home / Self Care | Attending: Cardiology

## 2019-07-06 DIAGNOSIS — Z9861 Coronary angioplasty status: Secondary | ICD-10-CM

## 2019-07-06 DIAGNOSIS — Z87891 Personal history of nicotine dependence: Secondary | ICD-10-CM

## 2019-07-06 DIAGNOSIS — I10 Essential (primary) hypertension: Secondary | ICD-10-CM | POA: Diagnosis present

## 2019-07-06 DIAGNOSIS — Q245 Malformation of coronary vessels: Secondary | ICD-10-CM | POA: Diagnosis not present

## 2019-07-06 DIAGNOSIS — Z791 Long term (current) use of non-steroidal anti-inflammatories (NSAID): Secondary | ICD-10-CM | POA: Diagnosis not present

## 2019-07-06 DIAGNOSIS — Z20822 Contact with and (suspected) exposure to covid-19: Secondary | ICD-10-CM | POA: Diagnosis present

## 2019-07-06 DIAGNOSIS — Z7902 Long term (current) use of antithrombotics/antiplatelets: Secondary | ICD-10-CM | POA: Diagnosis not present

## 2019-07-06 DIAGNOSIS — Z955 Presence of coronary angioplasty implant and graft: Secondary | ICD-10-CM

## 2019-07-06 DIAGNOSIS — Z882 Allergy status to sulfonamides status: Secondary | ICD-10-CM | POA: Diagnosis not present

## 2019-07-06 DIAGNOSIS — E1151 Type 2 diabetes mellitus with diabetic peripheral angiopathy without gangrene: Secondary | ICD-10-CM | POA: Diagnosis present

## 2019-07-06 DIAGNOSIS — Z794 Long term (current) use of insulin: Secondary | ICD-10-CM | POA: Diagnosis not present

## 2019-07-06 DIAGNOSIS — I25118 Atherosclerotic heart disease of native coronary artery with other forms of angina pectoris: Secondary | ICD-10-CM | POA: Diagnosis present

## 2019-07-06 DIAGNOSIS — Z7982 Long term (current) use of aspirin: Secondary | ICD-10-CM

## 2019-07-06 DIAGNOSIS — Z79899 Other long term (current) drug therapy: Secondary | ICD-10-CM | POA: Diagnosis not present

## 2019-07-06 HISTORY — PX: INTRAVASCULAR ULTRASOUND/IVUS: CATH118244

## 2019-07-06 HISTORY — PX: CORONARY STENT INTERVENTION: CATH118234

## 2019-07-06 HISTORY — PX: LEFT HEART CATH AND CORONARY ANGIOGRAPHY: CATH118249

## 2019-07-06 LAB — POCT ACTIVATED CLOTTING TIME
Activated Clotting Time: 235 seconds
Activated Clotting Time: 241 seconds
Activated Clotting Time: 329 seconds
Activated Clotting Time: 340 seconds

## 2019-07-06 LAB — BASIC METABOLIC PANEL
Anion gap: 8 (ref 5–15)
BUN: 10 mg/dL (ref 8–23)
CO2: 28 mmol/L (ref 22–32)
Calcium: 9.1 mg/dL (ref 8.9–10.3)
Chloride: 105 mmol/L (ref 98–111)
Creatinine, Ser: 0.8 mg/dL (ref 0.44–1.00)
GFR calc Af Amer: >60 mL/min (ref >60)
GFR calc non Af Amer: >60 mL/min (ref >60)
Glucose, Bld: 96 mg/dL (ref 70–99)
Potassium: 4.1 mmol/L (ref 3.5–5.1)
Sodium: 141 mmol/L (ref 135–145)

## 2019-07-06 LAB — GLUCOSE, CAPILLARY: Glucose-Capillary: 93 mg/dL (ref 70–99)

## 2019-07-06 SURGERY — LEFT HEART CATH AND CORONARY ANGIOGRAPHY
Anesthesia: LOCAL

## 2019-07-06 MED ORDER — HEPARIN (PORCINE) IN NACL 1000-0.9 UT/500ML-% IV SOLN
INTRAVENOUS | Status: AC
  Filled 2019-07-06: qty 1000

## 2019-07-06 MED ORDER — SODIUM CHLORIDE 0.9 % IV SOLN: 250.0000 mL | INTRAVENOUS | Status: DC | PRN

## 2019-07-06 MED ORDER — FENTANYL CITRATE (PF) 100 MCG/2ML IJ SOLN
INTRAMUSCULAR | Status: DC | PRN
  Administered 2019-07-06: 50 ug via INTRAVENOUS
  Administered 2019-07-06: 25 ug via INTRAVENOUS
  Administered 2019-07-06 (×2): 50 ug via INTRAVENOUS
  Administered 2019-07-06: 25 ug via INTRAVENOUS
  Administered 2019-07-06: 50 ug via INTRAVENOUS

## 2019-07-06 MED ORDER — SODIUM CHLORIDE 0.9 % WEIGHT BASED INFUSION
3.0000 mL/kg/h | INTRAVENOUS | Status: AC
  Administered 2019-07-06: 3 mL/kg/h via INTRAVENOUS

## 2019-07-06 MED ORDER — HYDRALAZINE HCL 20 MG/ML IJ SOLN: 10.0000 mg | INTRAMUSCULAR | Status: AC | PRN

## 2019-07-06 MED ORDER — ATROPINE SULFATE 1 MG/10ML IJ SOSY
PREFILLED_SYRINGE | INTRAMUSCULAR | Status: DC | PRN
  Administered 2019-07-06: 0.5 mg via INTRAVENOUS
  Administered 2019-07-06: 1 mg via INTRAVENOUS

## 2019-07-06 MED ORDER — HEPARIN (PORCINE) IN NACL 1000-0.9 UT/500ML-% IV SOLN
INTRAVENOUS | Status: DC | PRN
  Administered 2019-07-06 (×4): 500 mL

## 2019-07-06 MED ORDER — SODIUM CHLORIDE 0.9 % IV SOLN: INTRAVENOUS | Status: AC

## 2019-07-06 MED ORDER — ATROPINE SULFATE 1 MG/10ML IJ SOSY
PREFILLED_SYRINGE | INTRAMUSCULAR | Status: AC
  Filled 2019-07-06: qty 10

## 2019-07-06 MED ORDER — CHLORHEXIDINE GLUCONATE CLOTH 2 % EX PADS
6.0000 | MEDICATED_PAD | Freq: Every day | CUTANEOUS | Status: DC
  Administered 2019-07-07: 6 via TOPICAL

## 2019-07-06 MED ORDER — LIDOCAINE HCL (PF) 1 % IJ SOLN
INTRAMUSCULAR | Status: DC | PRN
  Administered 2019-07-06: 20 mL
  Administered 2019-07-06: 10 mL
  Administered 2019-07-06: 2 mL
  Administered 2019-07-06: 20 mL
  Administered 2019-07-06: 2 mL

## 2019-07-06 MED ORDER — ASPIRIN 81 MG PO CHEW: 81.0000 mg | CHEWABLE_TABLET | ORAL | Status: DC

## 2019-07-06 MED ORDER — HEPARIN (PORCINE) IN NACL 1000-0.9 UT/500ML-% IV SOLN
INTRAVENOUS | Status: AC
  Filled 2019-07-06: qty 500

## 2019-07-06 MED ORDER — HEPARIN SODIUM (PORCINE) 1000 UNIT/ML IJ SOLN
INTRAMUSCULAR | Status: DC | PRN
  Administered 2019-07-06 (×2): 3000 [IU] via INTRAVENOUS
  Administered 2019-07-06: 7000 [IU] via INTRAVENOUS

## 2019-07-06 MED ORDER — MIDAZOLAM HCL 2 MG/2ML IJ SOLN
INTRAMUSCULAR | Status: AC
  Filled 2019-07-06: qty 2

## 2019-07-06 MED ORDER — HEPARIN SODIUM (PORCINE) 1000 UNIT/ML IJ SOLN
INTRAMUSCULAR | Status: AC
  Filled 2019-07-06: qty 1

## 2019-07-06 MED ORDER — VERAPAMIL HCL 2.5 MG/ML IV SOLN
INTRAVENOUS | Status: AC
  Filled 2019-07-06: qty 2

## 2019-07-06 MED ORDER — FAMOTIDINE IN NACL 20-0.9 MG/50ML-% IV SOLN
INTRAVENOUS | Status: AC
  Filled 2019-07-06: qty 50

## 2019-07-06 MED ORDER — NITROGLYCERIN IN D5W 200-5 MCG/ML-% IV SOLN
INTRAVENOUS | Status: AC
  Filled 2019-07-06: qty 250

## 2019-07-06 MED ORDER — SODIUM CHLORIDE 0.9% FLUSH: 3.0000 mL | Freq: Two times a day (BID) | INTRAVENOUS | Status: DC

## 2019-07-06 MED ORDER — NITROGLYCERIN 0.4 MG SL SUBL
SUBLINGUAL_TABLET | SUBLINGUAL | Status: AC
  Filled 2019-07-06: qty 1

## 2019-07-06 MED ORDER — ONDANSETRON HCL 4 MG/2ML IJ SOLN
INTRAMUSCULAR | Status: AC
  Filled 2019-07-06: qty 2

## 2019-07-06 MED ORDER — NOREPINEPHRINE BITARTRATE 1 MG/ML IV SOLN
INTRAVENOUS | Status: DC | PRN
  Administered 2019-07-06: 21:00:00 20 ug/min via INTRAVENOUS

## 2019-07-06 MED ORDER — LABETALOL HCL 5 MG/ML IV SOLN: 10.0000 mg | INTRAVENOUS | Status: AC | PRN

## 2019-07-06 MED ORDER — LIDOCAINE HCL (PF) 1 % IJ SOLN
INTRAMUSCULAR | Status: AC
  Filled 2019-07-06: qty 30

## 2019-07-06 MED ORDER — SODIUM CHLORIDE 0.9 % IV SOLN
INTRAVENOUS | Status: AC | PRN
  Administered 2019-07-06: 10 mL/h via INTRAVENOUS

## 2019-07-06 MED ORDER — NITROGLYCERIN 1 MG/10 ML FOR IR/CATH LAB
INTRA_ARTERIAL | Status: DC | PRN
  Administered 2019-07-06 (×2): 200 ug via INTRACORONARY

## 2019-07-06 MED ORDER — NITROGLYCERIN 1 MG/10 ML FOR IR/CATH LAB
INTRA_ARTERIAL | Status: AC
  Filled 2019-07-06: qty 10

## 2019-07-06 MED ORDER — IOHEXOL 350 MG/ML SOLN
INTRAVENOUS | Status: DC | PRN
  Administered 2019-07-06: 22:00:00 205 mL via INTRA_ARTERIAL

## 2019-07-06 MED ORDER — CLOPIDOGREL BISULFATE 300 MG PO TABS
ORAL_TABLET | ORAL | Status: DC | PRN
  Administered 2019-07-06: 600 mg via ORAL

## 2019-07-06 MED ORDER — CLOPIDOGREL BISULFATE 75 MG PO TABS: 75.0000 mg | ORAL_TABLET | Freq: Every day | ORAL | Status: DC

## 2019-07-06 MED ORDER — ONDANSETRON HCL 4 MG/2ML IJ SOLN
INTRAMUSCULAR | Status: DC | PRN
  Administered 2019-07-06: 4 mg via INTRAVENOUS

## 2019-07-06 MED ORDER — CLOPIDOGREL BISULFATE 300 MG PO TABS
ORAL_TABLET | ORAL | Status: AC
  Filled 2019-07-06: qty 2

## 2019-07-06 MED ORDER — ACETAMINOPHEN 325 MG PO TABS
650.0000 mg | ORAL_TABLET | ORAL | Status: DC | PRN
  Administered 2019-07-07: 650 mg via ORAL
  Filled 2019-07-06: qty 2

## 2019-07-06 MED ORDER — SODIUM CHLORIDE 0.9% FLUSH: 3.0000 mL | INTRAVENOUS | Status: DC | PRN

## 2019-07-06 MED ORDER — ONDANSETRON HCL 4 MG/2ML IJ SOLN: 4.0000 mg | Freq: Four times a day (QID) | INTRAMUSCULAR | Status: DC | PRN

## 2019-07-06 MED ORDER — NITROGLYCERIN IN D5W 200-5 MCG/ML-% IV SOLN
INTRAVENOUS | Status: AC | PRN
  Administered 2019-07-06: 40 ug/kg/min via INTRAVENOUS

## 2019-07-06 MED ORDER — CEFAZOLIN SODIUM-DEXTROSE 2-3 GM-%(50ML) IV SOLR
INTRAVENOUS | Status: AC | PRN
  Administered 2019-07-06: 2 g via INTRAVENOUS

## 2019-07-06 MED ORDER — MIDAZOLAM HCL 2 MG/2ML IJ SOLN
INTRAMUSCULAR | Status: DC | PRN
  Administered 2019-07-06: 2 mg via INTRAVENOUS
  Administered 2019-07-06 (×2): 1 mg via INTRAVENOUS

## 2019-07-06 MED ORDER — SODIUM CHLORIDE 0.9 % WEIGHT BASED INFUSION
1.0000 mL/kg/h | INTRAVENOUS | Status: DC
  Administered 2019-07-06: 1 mL/kg/h via INTRAVENOUS

## 2019-07-06 MED ORDER — NITROGLYCERIN 0.4 MG SL SUBL
SUBLINGUAL_TABLET | SUBLINGUAL | Status: DC | PRN
  Administered 2019-07-06: .4 mg via SUBLINGUAL

## 2019-07-06 MED ORDER — FAMOTIDINE IN NACL 20-0.9 MG/50ML-% IV SOLN
INTRAVENOUS | Status: AC | PRN
  Administered 2019-07-06: 20 mg via INTRAVENOUS

## 2019-07-06 MED ORDER — FENTANYL CITRATE (PF) 100 MCG/2ML IJ SOLN
25.0000 ug | INTRAMUSCULAR | Status: DC | PRN
  Administered 2019-07-06: 25 ug via INTRAVENOUS
  Filled 2019-07-06: qty 2

## 2019-07-06 MED ORDER — FENTANYL CITRATE (PF) 100 MCG/2ML IJ SOLN
INTRAMUSCULAR | Status: AC
  Filled 2019-07-06: qty 2

## 2019-07-06 MED ORDER — CEFAZOLIN SODIUM-DEXTROSE 2-4 GM/100ML-% IV SOLN
INTRAVENOUS | Status: AC
  Filled 2019-07-06: qty 100

## 2019-07-06 MED ORDER — SODIUM CHLORIDE 0.9% FLUSH
3.0000 mL | Freq: Two times a day (BID) | INTRAVENOUS | Status: DC
  Administered 2019-07-07: 08:00:00 10 mL via INTRAVENOUS

## 2019-07-06 MED ORDER — NOREPINEPHRINE 4 MG/250ML-% IV SOLN
INTRAVENOUS | Status: AC
  Filled 2019-07-06: qty 250

## 2019-07-06 SURGICAL SUPPLY — 38 items
BALLN SAPPHIRE 3.0X15 (BALLOONS) ×2
BALLN SAPPHIRE ~~LOC~~ 4.0X15 (BALLOONS) ×2 IMPLANT
BALLOON SAPPHIRE 3.0X15 (BALLOONS) ×1 IMPLANT
CATH INFINITI 5 FR JL3.5 (CATHETERS) ×2 IMPLANT
CATH INFINITI 5FR MULTPACK ANG (CATHETERS) ×2 IMPLANT
CATH INFINITI JR4 5F (CATHETERS) ×2 IMPLANT
CATH OPTICROSS HD (CATHETERS) ×2 IMPLANT
CATH OPTITORQUE TIG 4.0 5F (CATHETERS) ×2 IMPLANT
CATH SUPERCROSS ANGLED 90 DEG (MICROCATHETER) ×2 IMPLANT
CATH VISTA GUIDE 6FR JR4 (CATHETERS) ×2 IMPLANT
CATH VISTA GUIDE 6FR XB3.5 (CATHETERS) ×2 IMPLANT
CATH VISTA GUIDE 6FR XBLAD3.5 (CATHETERS) ×2 IMPLANT
CLOSURE MYNX CONTROL 6F/7F (Vascular Products) ×2 IMPLANT
DEVICE CLOSURE PERCLS PRGLD 6F (VASCULAR PRODUCTS) ×1 IMPLANT
FEM STOP ARCH (HEMOSTASIS) ×2
GLIDESHEATH SLEND A-KIT 6F 22G (SHEATH) ×2 IMPLANT
GLIDESHEATH SLEND SS 6F .021 (SHEATH) ×2 IMPLANT
GUIDEWIRE INQWIRE 1.5J.035X260 (WIRE) ×1 IMPLANT
INQWIRE 1.5J .035X260CM (WIRE) ×2
KIT ENCORE 26 ADVANTAGE (KITS) ×2 IMPLANT
KIT HEART LEFT (KITS) ×4 IMPLANT
KIT HEMO VALVE WATCHDOG (MISCELLANEOUS) ×2 IMPLANT
KIT MICROPUNCTURE NIT STIFF (SHEATH) ×4 IMPLANT
PACK CARDIAC CATHETERIZATION (CUSTOM PROCEDURE TRAY) ×4 IMPLANT
PERCLOSE PROGLIDE 6F (VASCULAR PRODUCTS) ×2
SHEATH GLIDE SLENDER 4/5FR (SHEATH) ×2 IMPLANT
SHEATH PINNACLE 5F 10CM (SHEATH) ×2 IMPLANT
SHEATH PINNACLE 6F 10CM (SHEATH) ×4 IMPLANT
SHEATH PROBE COVER 6X72 (BAG) ×4 IMPLANT
SLED PULL BACK IVUS (MISCELLANEOUS) ×2 IMPLANT
STENT SYNERGY XD 3.50X20 (Permanent Stent) ×1 IMPLANT
SYNERGY XD 3.50X20 (Permanent Stent) ×2 IMPLANT
SYSTEM COMPRESSION FEMOSTOP (HEMOSTASIS) ×1 IMPLANT
TRANSDUCER W/STOPCOCK (MISCELLANEOUS) ×4 IMPLANT
TUBING CIL FLEX 10 FLL-RA (TUBING) ×4 IMPLANT
WIRE ASAHI PROWATER 180CM (WIRE) ×2 IMPLANT
WIRE EMERALD 3MM-J .035X150CM (WIRE) ×4 IMPLANT
WIRE HI TORQ WHISPER MS 190CM (WIRE) ×2 IMPLANT

## 2019-07-06 NOTE — H&P (Signed)
OV 4.14    Follow up visit  Subjective:   Chloe Garza, female    DOB: [birth year], 64 y.o.   MRN: [id number]    HPI   64 y.o. Caucasian female with type 2 DM, prior tobacco dependence, CAD, PAD  While stress test was reported to have no ischemia, she has extensive calcification throughout coronary arteries. On my personal review, there is mild inferoseptal ischemia.   Patient continues to have episodes of dull chest pain, which are more prominent on exertion.  Discussed test results with the patient.    No current facility-administered medications on file prior to encounter.   Current Outpatient Medications on File Prior to Encounter  Medication Sig Dispense Refill  . acetaminophen (TYLENOL) 650 MG CR tablet Take 650-1,300 mg by mouth every 8 (eight) hours as needed for pain.    Marland Kitchen aspirin EC 81 MG tablet Take 81 mg by mouth daily.     . Azilsartan-Chlorthalidone (EDARBYCLOR) 40-25 MG TABS Take 0.5 mg by mouth daily. (Patient taking differently: Take 0.5 mg by mouth daily with lunch. ) 30 tablet 1  . Bioflavonoid Products (VITAMIN C) CHEW Chew 1 tablet by mouth daily.    . diclofenac Sodium (VOLTAREN) 1 % GEL Apply 1 application topically 4 (four) times daily as needed (pain.).    . Insulin Glargine, 2 Unit Dial, (TOUJEO MAX SOLOSTAR) 300 UNIT/ML SOPN Inject 20 Units into the skin daily.    . metoprolol tartrate (LOPRESSOR) 25 MG tablet Take 1 tablet (25 mg total) by mouth 2 (two) times daily. 60 tablet 3  . Multiple Vitamins-Minerals (ZINC PO) Take 1 tablet by mouth daily.    Marland Kitchen omeprazole (PRILOSEC) 20 MG capsule Take 20 mg by mouth daily as needed (acid reflux/indigestion.).    Marland Kitchen Potassium 99 MG TABS Take 99 mg by mouth daily.     . rosuvastatin (CRESTOR) 10 MG tablet TAKE 1 TABLET(10 MG) BY MOUTH AT BEDTIME (Patient taking differently: Take 10 mg by mouth at bedtime. ) 30 tablet 3  . Turmeric (QC TUMERIC COMPLEX PO) Take 1 capsule by mouth daily.     . Vitamin D,  Cholecalciferol, 10 MCG (400 UNIT) CHEW Chew 400 Units by mouth daily.       Cardiovascular & other pertient studies:  CT cardiac scoring 06/21/2019: Extensive calcified coronary artery plaque putting the patient above the 90th percentile for age. CALCIUM SCORE: 871. LM: 136. LAD: 167. Cx: 95. RCA: 676 PDA: 0 Visible lung fields: Granulomas are present in the anterior upper lobes.  Lexiscan (Walking with Jaci Carrel) Sestamibi Stress Test04/09/2019: Nondiagnostic ECG stress. Resting EKG/ECG demonstrated normal sinus rhythm, left ventricular hypertrophy with secondary ST-T changes, repolarization abnormality. Peak EKG/ECG revealed no significant ST-T change from baseline abnormality. Myocardial perfusion is normal. Stress LV EF: 76%.  No previous exam available for comparison. Low risk study.    EKG 06/12/2019: Sinus bradycardia 50 bpm. Anterolateral T wave inversion, cannot exclude ischemia.  Event monitor 02/17/2019 - 03/02/2019: Diagnostic time: 94%  Dominant rhythm: Sinus. HR 56-117 bpm. Avg HR 75 bpm. No atrial fibrillation/atrial flutter/SVT/VT/high grade AV block, sinus pause >3sec noted. Symptoms reported: None  Echocardiogram 02/17/2019:  Left ventricle cavity is normal in size. Mild concentric hypertrophy of  the left ventricle. Normal LV systolic function with EF 55%. Normal global  wall motion. Doppler evidence of grade I (impaired) diastolic dysfunction,  normal LAP.  No significant valvular abnormality.  No evidence of pulmonary hypertension.  ABI 02/17/2019: This exam reveals normal  perfusion of the right lower extremity (ABI). This exam reveals normal perfusion of the left lower extremity (ABI).   Mildly abnormal biphasic waveform at the level of bilateral ankles.   Recent labs: 01/20/2019: Glucose 217.  BUN/creatinine 11/0.64.  eGFR normal.  Sodium 137, potassium 3.9.  Rest of the CMP normal. H/H 14/42.  MCV 87.  Platelets 240. Cholesterol 218,  triglycerides 293, HDL 44, LDL 114 Hemoglobin A1c 12.1%. ANA screen positive.   Review of Systems  Cardiovascular: Positive for chest pain and claudication (Improved). Negative for dyspnea on exertion, leg swelling, palpitations and syncope.         There were no vitals filed for this visit.   There is no height or weight on file to calculate BMI. There were no vitals filed for this visit.   Objective:   Physical Exam  Constitutional: She appears well-developed and well-nourished.  Neck: No JVD present.  Cardiovascular: Normal rate, regular rhythm and normal heart sounds. Exam reveals decreased pulses.  No murmur heard. Pulmonary/Chest: Effort normal and breath sounds normal. She has no wheezes. She has no rales.  Musculoskeletal:        General: No edema.  Nursing note and vitals reviewed.       Assessment & Recommendations:   64 y.o. Caucasian female with type 2 DM, prior tobacco dependence, CAD, PAD  CAD: Extensive calcification on CT cardiac scoring, including 136 in LM. Mild inferoseptal ischemia on my review.  Continue Aspirin, statin, metoprolol tartarate 25 mg bid. Further up titration of medical therapy is limited due to her low normal blood pressure.  Her continued symptoms, recommend coronary angiography and possible intervention.  We will schedule for 07/06/2019 afternoon.  Claudication: She likely has mild PAD, without any lifestyle limiting symptoms. Currently not taking cilostazol.  Type 2 diabetes mellitus: Continue follow-up with PCP.    Nigel Mormon, MD [facility] Pager: [phone] Office: [phone]

## 2019-07-06 NOTE — Progress Notes (Signed)
Successful Prox RCA PCI Unsuccessful closure Rt CFA requiring Femstop Anterior ST elevation post procedure requiring urgent re-look angiography through left CFA All vessels open. RCA stent wide open. ST elevation likely due to vasospasm. Left CFA Perclose successful Bed rest 6 hours after Femstop removal Full procedure note to follow.  Elder Negus, MD [facility] Pager: [phone] Office: [phone]

## 2019-07-06 NOTE — Interval H&P Note (Signed)
History and Physical Interval Note:  07/06/2019 5:35 PM  Chloe Garza  has presented today for surgery, with the diagnosis of Angina.  The various methods of treatment have been discussed with the patient and family. After consideration of risks, benefits and other options for treatment, the patient has consented to  Procedure(s): LEFT HEART CATH AND CORONARY ANGIOGRAPHY (N/A) as a surgical intervention.  The patient's history has been reviewed, patient examined, no change in status, stable for surgery.  I have reviewed the patient's chart and labs.  Questions were answered to the patient's satisfaction.    2016/2017 Appropriate Use Criteria for Coronary Revascularization Clinical Presentation: Diabetes Mellitus? Symptom Status? S/P CABG? Antianginal Therapy (# of long-acting drugs)? Results of Non-invasive testing? FFR/iFR results in all diseased vessels? Patient undergoing renal transplant? Patient undergoing percutaneous valve procedure (TAVR, MitraClip, Others)? Symptom Status:  Ischemic Symptoms  Non-invasive Testing:  Intermediate Risk  If no or indeterminate stress test, FFR/iFR results in all diseased vessels:  N/A  Diabetes Mellitus:  Yes  S/P CABG:  No  Antianginal therapy (number of long-acting drugs):  >=2  Patient undergoing renal transplant:  No  Patient undergoing percutaneous valve procedure:  No    newline 1 Vessel Disease PCI CABG  No proximal LAD involvement, No proximal left dominant LCX involvement A (8); Indication 2 M (6); Indication 2   Proximal left dominant LCX involvement A (8); Indication 5 A (8); Indication 5   Proximal LAD involvement A (8); Indication 5 A (8); Indication 5   newline 2 Vessel Disease  No proximal LAD involvement A (8); Indication 8 A (7); Indication 8   Proximal LAD involvement A (8); Indication 14 A (9); Indication 14   newline 3 Vessel Disease  Low disease complexity (e.g., focal stenoses, SYNTAX <=22) A (7); Indication 19 A (9);  Indication 19   Intermediate or high disease complexity (e.g., SYNTAX >=23) M (6); Indication 23 A (9); Indication 23   newline Left Main Disease  Isolated LMCA disease: ostial or midshaft A (7); Indication 24 A (9); Indication 24   Isolated LMCA disease: bifurcation involvement M (6); Indication 25 A (9); Indication 25   LMCA ostial or midshaft, concurrent low disease burden multivessel disease (e.g., 1-2 additional focal stenoses, SYNTAX <=22) A (7); Indication 26 A (9); Indication 26   LMCA ostial or midshaft, concurrent intermediate or high disease burden multivessel disease (e.g., 1-2 additional bifurcation stenoses, long stenoses, SYNTAX >=23) M (4); Indication 27 A (9); Indication 27   LMCA bifurcation involvement, concurrent low disease burden multivessel disease (e.g., 1-2 additional focal stenoses, SYNTAX <=22) M (6); Indication 28 A (9); Indication 28   LMCA bifurcation involvement, concurrent intermediate or high disease burden multivessel disease (e.g., 1-2 additional bifurcation stenoses, long stenoses, SYNTAX >=23) R (3); Indication 29 A (9); Indication 29     Simren Popson J Nevia Henkin

## 2019-07-06 NOTE — Research (Signed)
CADFEM Informed Consent   Subject Name: Chloe Garza  Subject met inclusion and exclusion criteria.  The informed consent form, study requirements and expectations were reviewed with the subject and questions and concerns were addressed prior to the signing of the consent form.  The subject verbalized understanding of the trail requirements.  The subject agreed to participate in the CADFEM trial and signed the informed consent.  The informed consent was obtained prior to performance of any protocol-specific procedures for the subject.  A copy of the signed informed consent was given to the subject and a copy was placed in the subject's medical record.  Mena Goes. 07/06/2019, 1:50 PM

## 2019-07-07 LAB — COMPREHENSIVE METABOLIC PANEL
ALT: 22 U/L (ref 0–44)
AST: 28 U/L (ref 15–41)
Albumin: 3.3 g/dL — ABNORMAL LOW (ref 3.5–5.0)
Alkaline Phosphatase: 68 U/L (ref 38–126)
Anion gap: 10 (ref 5–15)
BUN: 9 mg/dL (ref 8–23)
CO2: 24 mmol/L (ref 22–32)
Calcium: 8.5 mg/dL — ABNORMAL LOW (ref 8.9–10.3)
Chloride: 103 mmol/L (ref 98–111)
Creatinine, Ser: 0.91 mg/dL (ref 0.44–1.00)
GFR calc Af Amer: >60 mL/min (ref >60)
GFR calc non Af Amer: >60 mL/min (ref >60)
Glucose, Bld: 84 mg/dL (ref 70–99)
Potassium: 3.8 mmol/L (ref 3.5–5.1)
Sodium: 137 mmol/L (ref 135–145)
Total Bilirubin: 0.4 mg/dL (ref 0.3–1.2)
Total Protein: 5.9 g/dL — ABNORMAL LOW (ref 6.5–8.1)

## 2019-07-07 LAB — CBC
HCT: 34 % — ABNORMAL LOW (ref 36.0–46.0)
Hemoglobin: 11.5 g/dL — ABNORMAL LOW (ref 12.0–15.0)
MCH: 30.8 pg (ref 26.0–34.0)
MCHC: 33.8 g/dL (ref 30.0–36.0)
MCV: 91.2 fL (ref 80.0–100.0)
Platelets: 326 10*3/uL (ref 150–400)
RBC: 3.73 MIL/uL — ABNORMAL LOW (ref 3.87–5.11)
RDW: 12.6 % (ref 11.5–15.5)
WBC: 11.4 10*3/uL — ABNORMAL HIGH (ref 4.0–10.5)
nRBC: 0 % (ref 0.0–0.2)

## 2019-07-07 LAB — GLUCOSE, CAPILLARY: Glucose-Capillary: 122 mg/dL — ABNORMAL HIGH (ref 70–99)

## 2019-07-07 LAB — MRSA PCR SCREENING: MRSA by PCR: NEGATIVE

## 2019-07-07 MED ORDER — CLOPIDOGREL BISULFATE 75 MG PO TABS
75.0000 mg | ORAL_TABLET | Freq: Every day | ORAL | Status: DC
  Administered 2019-07-07: 75 mg via ORAL
  Filled 2019-07-07: qty 1

## 2019-07-07 MED ORDER — CLOPIDOGREL BISULFATE 75 MG PO TABS: 75.0000 mg | ORAL_TABLET | Freq: Every day | ORAL | 2 refills | Status: DC

## 2019-07-07 MED FILL — CLOPIDOGREL 75 MG TABLET: 75 | 30 days supply | Qty: 30 | Fill #0

## 2019-07-07 NOTE — Progress Notes (Signed)
CARDIAC REHAB PHASE I   PRE:  Rate/Rhythm:     BP: sitting 128/62    SaO2:   MODE:  Ambulation: 370 ft   POST:  Rate/Rhythm: 90 SR    BP: sitting 109/65     SaO2:   Pt up walking with RN when I arrived. Feels tired but ok. Eager to d/c. Discussed stent, Plavix, restrictions, smoking cessation, diet, exercise, NTG and CRPII with pt and husband. Pt somewhat receptive, on edge to d/c. She is working on quitting smoking and sts she probably won't smoke at d/c. Will refer to [place] CRPII however pt not interested. [phone]   Harriet Masson CES, ACSM 07/07/2019 9:52 AM

## 2019-07-07 NOTE — Plan of Care (Signed)

## 2019-07-07 NOTE — Progress Notes (Signed)
Status:  Patient admitted overnight with Femostop in place.  VS WDL through night.  Small hematoma noted and has not worsened through night.  Patient's pain has resolved with removal of Femostop through night.  Remains on bedrest at this time.  Noted ST elevation has subsided on bedside monitor and no reports of ischemic type pain at this time.

## 2019-07-08 ENCOUNTER — Encounter: Payer: Self-pay | Admitting: Cardiology

## 2019-07-08 NOTE — Progress Notes (Deleted)
Follow up visit  Subjective:   Chloe Garza, female    DOB: 05/11/1955, 64 y.o.   MRN: [id number]    HPI   64 y.o.Caucasianfemalewith type 2 DM, prior tobacco dependence, CAD, PAD.  Patient underwent coronary angiogram on 07/06/19, given abnormal stress test and continued symptoms on optimally tolerated medical therapy. Details below. She underwent successful RCA intervention.   Mid LAD lesion is 80% stenosed. Tortuous vessel. 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 Unable to wire LAD in spite of multiple attempts with P[place]ater and Whisper wire. Angled [place] catheter was opened, but never entered the body. At this point, I decided to abort further attempts, in order to avoid CIN. Right groin closure was unsuccessful. I personally held pressure on Rt groin for 10 min, followed by Femostop placement. At this point, telemtery showed anterior ST elevations. Patient initially did not have chest pain, but develped thereafter. Suspecting spasm, we started IV NTG. However, patient's BP plummeted to 68/46 mmHg. Patient became pale, but never lost pulse. Norepinephrine was briefly started. Patietn was prepped again for emergenyc re-look angiography through left groin approach. This showed no acute vessel closure, RCA sent was patent. Her presentaiton was suspected to be due to coronary vasospasm in the setting of prominent myocardial bridge. Dr Einar Gip performed Perclose placement in Let CFA. 2 g Ancef was administered empirically, to avoid infections given emergent nature of the procedure.  Patient was admitted to [facility] for overnight monitoring. Femostop was discontinued as per protocol. Patients right leg pain resolved after removing Femostop. She had mild ecchymosis in right groin without discernible hematoma. She had no recurrent chest pain. Anterior ST elevations resolved. We decided to discuss LAD intervention in a few weeks.  Patient is here for hospital follow up.   Current Outpatient  Medications on File Prior to Visit  Medication Sig Dispense Refill  . acetaminophen (TYLENOL) 650 MG CR tablet Take 650-1,300 mg by mouth every 8 (eight) hours as needed for pain.    Marland Kitchen aspirin EC 81 MG tablet Take 81 mg by mouth daily.     . Azilsartan-Chlorthalidone (EDARBYCLOR) 40-25 MG TABS Take 0.5 mg by mouth daily. (Patient taking differently: Take 0.5 mg by mouth daily with lunch. ) 30 tablet 1  . Bioflavonoid Products (VITAMIN C) CHEW Chew 1 tablet by mouth daily.    . clopidogrel (PLAVIX) 75 MG tablet Take 1 tablet (75 mg total) by mouth daily. 30 tablet 2  . diclofenac Sodium (VOLTAREN) 1 % GEL Apply 1 application topically 4 (four) times daily as needed (pain.).    . Insulin Glargine, 2 Unit Dial, (TOUJEO MAX SOLOSTAR) 300 UNIT/ML SOPN Inject 20 Units into the skin daily.    . metoprolol tartrate (LOPRESSOR) 25 MG tablet Take 1 tablet (25 mg total) by mouth 2 (two) times daily. 60 tablet 3  . Multiple Vitamins-Minerals (ZINC PO) Take 1 tablet by mouth daily.    Marland Kitchen omeprazole (PRILOSEC) 20 MG capsule Take 20 mg by mouth daily as needed (acid reflux/indigestion.).    Marland Kitchen Potassium 99 MG TABS Take 99 mg by mouth daily.     . rosuvastatin (CRESTOR) 10 MG tablet TAKE 1 TABLET(10 MG) BY MOUTH AT BEDTIME (Patient taking differently: Take 10 mg by mouth at bedtime. ) 30 tablet 3  . Turmeric (QC TUMERIC COMPLEX PO) Take 1 capsule by mouth daily.     . Vitamin D, Cholecalciferol, 10 MCG (400 UNIT) CHEW Chew 400 Units by mouth daily.  No current facility-administered medications on file prior to visit.    Cardiovascular & other pertient studies:  Coronary angiography/intervention 07/06/2019: LM: Normal LAD: Tortuous vessel. Mid 80% stenosis, followed by a prominent myocardial bridge LCx: Normal Ramus: Normal RCA: Prox severe 80% stenosis        Mid 40% calcific disease  Intravascular ultrasound (IVUS) Successful percutaneous coronary intervention prox RCA     PTCA and stent placement  Synergy DES 3.5 X 20 mm      Post dilatation with 4.0X15 mm [place] balloon Unsuccessful Mynx closure Rt CFA requiring Femostop placement  Emergency re-look coronary angiography showed no acute vessel closure, patent RCA stent. Successful Perclosre closure Lt CFA  LVEDP 15 mmHg LVEF >65%  CT cardiac scoring 06/21/2019: Extensive calcified coronary artery plaque putting the patient above the 90th percentile for age. CALCIUM SCORE: 871. LM: 136. LAD: 167. Cx: 95. RCA: 473 PDA: 0 Visible lung fields: Granulomas are present in the anterior upper lobes.  EKG 06/12/2019: Sinus bradycardia 50 bpm. Anterolateral T wave inversion, cannot exclude ischemia.  Event monitor 02/17/2019 - 03/02/2019: Diagnostic time: 94%  Dominant rhythm: Sinus. HR 56-117 bpm. Avg HR 75 bpm. No atrial fibrillation/atrial flutter/SVT/VT/high grade AV block, sinus pause >3sec noted. Symptoms reported: None  Echocardiogram 02/17/2019:  Left ventricle cavity is normal in size. Mild concentric hypertrophy of  the left ventricle. Normal LV systolic function with EF 55%. Normal global  wall motion. Doppler evidence of grade I (impaired) diastolic dysfunction,  normal LAP.  No significant valvular abnormality.  No evidence of pulmonary hypertension.  ABI 02/17/2019: This exam reveals normal perfusion of the right lower extremity (ABI). This exam reveals normal perfusion of the left lower extremity (ABI).   Mildly abnormal biphasic waveform at the level of bilateral ankles.   Recent labs: 01/20/2019: Glucose 217.  BUN/creatinine 11/0.64.  eGFR normal.  Sodium 137, potassium 3.9.  Rest of the CMP normal. H/H 14/42.  MCV 87.  Platelets 240. Cholesterol 218, triglycerides 293, HDL 44, LDL 114 Hemoglobin A1c 12.1%. ANA screen positive.   Review of Systems  Cardiovascular: Positive for chest pain and claudication (Improved). Negative for dyspnea on exertion, leg swelling, palpitations and syncope.        ***  There were no vitals filed for this visit.  *** There is no height or weight on file to calculate BMI. There were no vitals filed for this visit.   Objective:   Physical Exam  Constitutional: She appears well-developed and well-nourished.  Neck: No JVD present.  Cardiovascular: Normal rate, regular rhythm and normal heart sounds. Exam reveals decreased pulses.  No murmur heard. Pulmonary/Chest: Effort normal and breath sounds normal. She has no wheezes. She has no rales.  Musculoskeletal:        General: No edema.  Nursing note and vitals reviewed.       Assessment & Recommendations:   63 y.o. Caucasian female with type 2 DM, prior tobacco dependence, CAD, PAD  CAD: Successful RCA intervention (06/2019) Residual mid LAD 80% stenosis, followed by prominent myocardial bridge  Continue Aspirin, statin, metoprolol tartarate 25 mg bid. ***  Claudication: She likely has mild PAD, without any lifestyle limiting symptoms.   Type 2 diabetes mellitus: Continue follow-up with PCP.    Manish J Patwardhan, MD [facility] Pager: [phone] Office: [phone]    

## 2019-07-08 NOTE — Discharge Summary (Addendum)
Physician Discharge Summary  Patient ID: Chloe Garza MRN: [id number] DOB/AGE: Sep 11, 1955 64 y.o.  Admit date: 07/06/2019 Discharge date: 07/08/2019  Primary Discharge Diagnosis: Coronary artery disease  Secondary Discharge Diagnosis: Hypertension Type 2 diabetes mellitus Tobacco dependence Peripheral artery disease   Hospital Course:   64 y.o.Caucasianfemalewith type 2 DM, prior tobacco dependence, CAD, PAD.  Patient underwent coronary angiogram, given abnormal stress test and continued symptoms on optimally tolerated medical therapy. Details below. She underwent successful RCA intervention.   Mid LAD lesion is 80% stenosed. Tortuous vessel. 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 Unable to wire LAD in spite of multiple attempts with [place] and Whisper wire. Angled [place] catheter was opened, but never entered the body. At this point, I decided to abort further attempts, in order to avoid CIN. Right groin closure was unsuccessful. I personally held pressure on Rt groin for 10 min, followed by Femostop placement. At this point, telemtery showed anterior ST elevations. Patient initially did not have chest pain, but develped thereafter. Suspecting spasm, we started IV NTG. However, patient's BP plummeted to 68/46 mmHg. Patient became pale, but never lost pulse. Norepinephrine was briefly started. Patietn was prepped again for emergenyc re-look angiography through left groin approach. This showed no acute vessel closure, RCA sent was patent. Her presentaiton was suspected to be due to coronary vasospasm in the setting of prominent myocardial bridge. Dr Jacinto Halim performed Perclose placement in Let CFA. 2 g Ancef was administered empirically, to avoid infections given emergent nature of the procedure.  Patient was admitted to 2 Heart for overnight monitoring. Femostop was discontinued as per protocol. Patients right leg pain resolved after removing Femostop. She had mild ecchymosis in right  groin without discernible hematoma. She had no recurrent chest pain. Anterior ST elevations resolved.  Will see her for outpatient follow up on 4/26. Will stage LAD intervention in a few weeks.   Discharge Exam: Blood pressure 128/62, pulse 67, temperature 98.2 F (36.8 C), resp. rate 18, height 5\' 4"  (1.626 m), weight 68.1 kg, SpO2 92 %.    Physical Exam  Constitutional: She is oriented to person, place, and time. She appears well-developed and well-nourished. No distress.  HENT:  Head: Normocephalic and atraumatic.  Eyes: Pupils are equal, round, and reactive to light. Conjunctivae are normal.  Neck: No JVD present.  Cardiovascular: Normal rate, regular rhythm, normal heart sounds and intact distal pulses.  No murmur heard. Right groin ecchymosis. No hematoma.  Pulmonary/Chest: Effort normal and breath sounds normal. She has no wheezes. She has no rales.  Abdominal: Soft. Bowel sounds are normal. There is no rebound.  Musculoskeletal:        General: No edema.  Lymphadenopathy:    She has no cervical adenopathy.  Neurological: She is alert and oriented to person, place, and time. No cranial nerve deficit.  Skin: Skin is warm and dry.  Psychiatric: She has a normal mood and affect.  Nursing note and vitals reviewed.   Recommendations on discharge:  Increase oral hydration   Significant Diagnostic Studies:  Coronary angiography/intervention 07/06/2019: LM: Normal LAD: Tortuous vessel. Mid 80% stenosis, followed by a prominent myocardial bridge LCx: Normal Ramus: Normal RCA: Prox severe 80% stenosis        Mid 40% calcific disease  Intravascular ultrasound (IVUS) Successful percutaneous coronary intervention prox RCA     PTCA and stent placement Synergy DES 3.5 X 20 mm      Post dilatation with 4.0X15 mm [place] balloon Unsuccessful Mynx closure Rt CFA requiring  Femostop placement  Emergency re-look coronary angiography showed no acute vessel closure, patent RCA  stent. Successful Perclosre closure Lt CFA  LVEDP 15 mmHg LVEF >65%   EKG 07/07/2019: Sinus rhythm. Anterolateral nonspecific ST-T changes. Significant improvement compared to EKG 07/06/2019 showing anterior ST elevation   Labs:   Lab Results  Component Value Date   WBC 11.4 (H) 07/07/2019   HGB 11.5 (L) 07/07/2019   HCT 34.0 (L) 07/07/2019   MCV 91.2 07/07/2019   PLT 326 07/07/2019    Recent Labs  Lab 07/07/19 0605  NA 137  K 3.8  CL 103  CO2 24  BUN 9  CREATININE 0.91  CALCIUM 8.5*  PROT 5.9*  BILITOT 0.4  ALKPHOS 68  ALT 22  AST 28  GLUCOSE 84    Radiology: CARDIAC CATHETERIZATION  Result Date: 07/07/2019 Intravascular ultrasound (IVUS) Successful percutaneous coronary intervention prox RCA     PTCA and stent placement Synergy DES 3.5 X 20 mm     Post dilatation with 4.0X15 mm [place] balloon Unsuccessful Mynx closure Rt CFA requiring Femostop placement Emergency re-look coronary angiography showed patent RCA stent/ LVEDP 15 mmHg LVEF >65% Lovenia Debruler Esther Hardy, MD [facility] [place] Pager: [phone] Office: [phone]    PCV MYOCARDIAL PERFUSION WITH LEXISCAN  Result Date: 06/21/2019 Carlton Adam (Walking with Jaci Carrel) Sestamibi Stress Test 06/21/2019: Nondiagnostic ECG stress. Resting EKG/ECG demonstrated normal sinus rhythm, left ventricular hypertrophy with secondary ST-T changes, repolarization abnormality. Peak EKG/ECG revealed no significant ST-T change from baseline abnormality. Myocardial perfusion is normal. Stress LV EF: 76%. No previous exam available for comparison. Low risk study.      FOLLOW UP PLANS AND APPOINTMENTS Discharge Instructions    Amb Referral to Cardiac Rehabilitation   Complete by: As directed    Diagnosis:  Coronary Stents PTCA     After initial evaluation and assessments completed: Virtual Based Care may be provided alone or in conjunction with Phase 2 Cardiac Rehab based on patient barriers.: Yes   Diet - low sodium  heart healthy   Complete by: As directed    Increase activity slowly   Complete by: As directed      Allergies as of 07/07/2019      Reactions   Sulfa Antibiotics Hives      Medication List    TAKE these medications   acetaminophen 650 MG CR tablet Commonly known as: TYLENOL Take 650-1,300 mg by mouth every 8 (eight) hours as needed for pain.   aspirin EC 81 MG tablet Take 81 mg by mouth daily.   clopidogrel 75 MG tablet Commonly known as: PLAVIX Take 1 tablet (75 mg total) by mouth daily.   Edarbyclor 40-25 MG Tabs Generic drug: Azilsartan-Chlorthalidone Take 0.5 mg by mouth daily. What changed: when to take this   metoprolol tartrate 25 MG tablet Commonly known as: LOPRESSOR Take 1 tablet (25 mg total) by mouth 2 (two) times daily.   omeprazole 20 MG capsule Commonly known as: PRILOSEC Take 20 mg by mouth daily as needed (acid reflux/indigestion.).   Potassium 99 MG Tabs Take 99 mg by mouth daily.   QC TUMERIC COMPLEX PO Take 1 capsule by mouth daily.   rosuvastatin 10 MG tablet Commonly known as: CRESTOR TAKE 1 TABLET(10 MG) BY MOUTH AT BEDTIME What changed: See the new instructions.   Toujeo Max SoloStar 300 UNIT/ML Solostar Pen Generic drug: insulin glargine (2 Unit Dial) Inject 20 Units into the skin daily.   Vitamin C Chew Chew 1 tablet by  mouth daily.   Vitamin D (Cholecalciferol) 10 MCG (400 UNIT) Chew Chew 400 Units by mouth daily.   Voltaren 1 % Gel Generic drug: diclofenac Sodium Apply 1 application topically 4 (four) times daily as needed (pain.).   ZINC PO Take 1 tablet by mouth daily.      Follow-up Information    Elder Negus, MD Follow up on 07/10/2019.   Specialties: Cardiology, Radiology Why: 2:45 PM Contact information: [address] [place] [address] [phone]           Time spent: 50 min  Truett Mainland MD, [facility] Pager: [phone] Office:  [phone] If no answer: [phone]

## 2019-07-10 ENCOUNTER — Encounter: Payer: Self-pay | Admitting: Cardiology

## 2019-07-10 ENCOUNTER — Other Ambulatory Visit: Payer: Self-pay

## 2019-07-10 ENCOUNTER — Ambulatory Visit: Payer: BC Managed Care – PPO | Admitting: Cardiology

## 2019-07-10 VITALS — BP 110/64 | HR 77 | Temp 98.3°F | Ht 64.0 in | Wt 143.0 lb

## 2019-07-10 DIAGNOSIS — Z720 Tobacco use: Secondary | ICD-10-CM

## 2019-07-10 DIAGNOSIS — K219 Gastro-esophageal reflux disease without esophagitis: Secondary | ICD-10-CM

## 2019-07-10 DIAGNOSIS — I2510 Atherosclerotic heart disease of native coronary artery without angina pectoris: Secondary | ICD-10-CM | POA: Diagnosis not present

## 2019-07-10 DIAGNOSIS — E783 Hyperchylomicronemia: Secondary | ICD-10-CM | POA: Diagnosis not present

## 2019-07-10 DIAGNOSIS — I25118 Atherosclerotic heart disease of native coronary artery with other forms of angina pectoris: Secondary | ICD-10-CM | POA: Diagnosis not present

## 2019-07-10 MED ORDER — FAMOTIDINE 40 MG PO TABS: 40.0000 mg | ORAL_TABLET | Freq: Every day | ORAL | 1 refills | Status: DC | PRN

## 2019-07-10 MED ORDER — NITROGLYCERIN 0.4 MG SL SUBL: 0.4000 mg | SUBLINGUAL_TABLET | SUBLINGUAL | 3 refills | Status: DC | PRN

## 2019-07-10 NOTE — Progress Notes (Signed)
Primary Physician/Referring:  Jilda Panda, MD  Patient ID: Chloe Garza, female    DOB: 1956-01-01, 64 y.o.   MRN: [id number]  Chief Complaint  Patient presents with  . Coronary Artery Disease   HPI:    Chloe Garza  is a 64 y.o. Caucasian female with diabetes mellitus, hypertension, hyperlipidemia, coronary artery disease with markedly elevated coronary calcium score, due to persistent symptoms of angina, underwent coronary angiogram on 07/06/2019 and successful angioplasty to the ostial/proximal RCA and has residual mid LAD high-grade 80% stenosis.  Procedure was complicated by postprocedural chest discomfort and chest tightness with ST elevation in the anterior leads needing repeat angiography revealing widely patent RCA stent and no change in LAD anatomy.  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  She now presents to the office for follow-up.  She has had mild exertional chest discomfort since angioplasty but otherwise no other complications, no rest pain and she has slowly increase her physical activity.  Unfortunately still smoking about 3-4 cigarettes a day.  Past Medical History:  Diagnosis Date  . Diabetes mellitus without complication ([place])   . Hyperlipidemia   . Hypertension    Past Surgical History:  Procedure Laterality Date  . CATARACT EXTRACTION, BILATERAL    . CORONARY STENT INTERVENTION N/A 07/06/2019   Procedure: CORONARY STENT INTERVENTION;  Surgeon: Nigel Mormon, MD;  Location: [place] [facility];  Service: Cardiovascular;  Laterality: N/A;  RCA prox  . INTRAVASCULAR ULTRASOUND/IVUS N/A 07/06/2019   Procedure: Intravascular Ultrasound/IVUS;  Surgeon: Nigel Mormon, MD;  Location: [place] [facility];  Service: Cardiovascular;  Laterality: N/A;  . LEFT HEART CATH AND CORONARY ANGIOGRAPHY N/A 07/06/2019   Procedure: LEFT HEART CATH AND CORONARY ANGIOGRAPHY;  Surgeon: Nigel Mormon, MD;  Location: [place] [facility];  Service:  Cardiovascular;  Laterality: N/A;  . TONSILLECTOMY    . TUBAL LIGATION     Family History  Problem Relation Age of Onset  . Hypertension Mother   . Stroke Mother   . Alzheimer's disease Mother   . Heart attack Father   . Pneumonia Father   . CAD Sister   . Hypertension Sister   . Hypertension Brother   . Heart failure Brother   . Hypertension Sister   . Hypertension Sister   . Hypertension Sister   . Hypertension Brother   . Heart failure Brother   . Hypertension Brother     Social History   Tobacco Use  . Smoking status: Light Tobacco Smoker    Packs/day: 0.25  . Smokeless tobacco: Never Used  . Tobacco comment: Patient is dow to once cigarette a day  Substance Use Topics  . Alcohol use: Not Currently   Marital Status: Married  ROS  Review of Systems  Cardiovascular: Positive for chest pain and dyspnea on exertion. Negative for leg swelling.  Gastrointestinal: Negative for melena.   Objective  Blood pressure 110/64, pulse 77, temperature 98.3 F (36.8 C), height '5\' 4"'  (1.626 m), weight 143 lb (64.9 kg), SpO2 97 %.  Vitals with BMI 07/10/2019 07/07/2019 07/07/2019  Height '5\' 4"'  - -  Weight 143 lbs - -  BMI 97.02 - -  Systolic 637 858 850  Diastolic 64 62 64  Pulse 77 67 68     Physical Exam  Cardiovascular: Normal rate, regular rhythm and normal heart sounds. Exam reveals no gallop.  No murmur heard. Pulses:      Carotid pulses are 2+ on the right side  and 2+ on the left side.      Femoral pulses are 2+ on the right side and 2+ on the left side.      Dorsalis pedis pulses are 1+ on the right side and 1+ on the left side.       Posterior tibial pulses are 0 on the right side and 0 on the left side.  No leg edema, no JVD. Mild ecchymosis right groin site.  Left groin site without hematoma or ecchymosis.  No bruit.  Pulmonary/Chest: Effort normal and breath sounds normal.  Abdominal: Soft. Bowel sounds are normal.   Laboratory examination:   Recent Labs     07/06/19 1311 07/07/19 0605  NA 141 137  K 4.1 3.8  CL 105 103  CO2 28 24  GLUCOSE 96 84  BUN 10 9  CREATININE 0.80 0.91  CALCIUM 9.1 8.5*  GFRNONAA >60 >60  GFRAA >60 >60   estimated creatinine clearance is 56.1 mL/min (by C-G formula based on SCr of 0.91 mg/dL).  CMP Latest Ref Rng & Units 07/07/2019 07/06/2019  Glucose 70 - 99 mg/dL 84 96  BUN 8 - 23 mg/dL 9 10  Creatinine 0.44 - 1.00 mg/dL 0.91 0.80  Sodium 135 - 145 mmol/L 137 141  Potassium 3.5 - 5.1 mmol/L 3.8 4.1  Chloride 98 - 111 mmol/L 103 105  CO2 22 - 32 mmol/L 24 28  Calcium 8.9 - 10.3 mg/dL 8.5(L) 9.1  Total Protein 6.5 - 8.1 g/dL 5.9(L) -  Total Bilirubin 0.3 - 1.2 mg/dL 0.4 -  Alkaline Phos 38 - 126 U/L 68 -  AST 15 - 41 U/L 28 -  ALT 0 - 44 U/L 22 -   CBC Latest Ref Rng & Units 07/07/2019 07/03/2019  WBC 4.0 - 10.5 K/uL 11.4(H) 9.7  Hemoglobin 12.0 - 15.0 g/dL 11.5(L) 12.7  Hematocrit 36.0 - 46.0 % 34.0(L) 37.6  Platelets 150 - 400 K/uL 326 361   Lipid Panel  No results found for: CHOL, TRIG, HDL, CHOLHDL, VLDL, LDLCALC, LDLDIRECT HEMOGLOBIN A1C No results found for: HGBA1C, MPG TSH No results for input(s): TSH in the last 8760 hours.  External labs:   01/20/2019: Glucose 217.  BUN/creatinine 11/0.64.  eGFR normal.  Sodium 137, potassium 3.9.  Rest of the CMP normal. H/H 14/42.  MCV 87.  Platelets 240. Cholesterol 218, triglycerides 293, HDL 44, LDL 114 Hemoglobin A1c 12.1%. ANA screen positive.  Medications and allergies   Allergies  Allergen Reactions  . Sulfa Antibiotics Hives     Current Outpatient Medications  Medication Instructions  . acetaminophen (TYLENOL) 650-1,300 mg, Oral, Every 8 hours PRN  . aspirin EC 81 mg, Oral, Daily  . Azilsartan-Chlorthalidone (EDARBYCLOR) 40-25 MG TABS 0.5 mg, Oral, Daily  . Bioflavonoid Products (VITAMIN C) CHEW 1 tablet, Oral, Daily  . clopidogrel (PLAVIX) 75 mg, Oral, Daily  . diclofenac Sodium (VOLTAREN) 1 % GEL 1 application, Topical, 4 times  daily PRN  . famotidine (PEPCID) 40 mg, Oral, Daily PRN  . metoprolol tartrate (LOPRESSOR) 25 mg, Oral, 2 times daily  . Multiple Vitamins-Minerals (ZINC PO) 1 tablet, Oral, Daily  . nitroGLYCERIN (NITROSTAT) 0.4 mg, Sublingual, Every 5 min PRN  . Potassium 99 mg, Oral, Daily  . rosuvastatin (CRESTOR) 10 MG tablet TAKE 1 TABLET(10 MG) BY MOUTH AT BEDTIME  . Toujeo Max SoloStar 20 Units, Subcutaneous, Daily  . Turmeric (QC TUMERIC COMPLEX PO) 1 capsule, Oral, Daily  . Vitamin D (Cholecalciferol) 400 Units, Oral, Daily  Radiology:   No results found.  Cardiac Studies:   CT cardiac scoring 06/21/2019: Extensive calcified coronary artery plaque putting the patient above the 90th percentile for age. CALCIUM SCORE: 871. LM: 136. LAD: 167. Cx: 95. RCA: 956 PDA: 0 Visible lung fields: Granulomas are present in the anterior upper lobes.  Lexiscan (Walking with Chloe Garza) Sestamibi Stress Test04/09/2019: Nondiagnostic ECG stress. Resting EKG/ECG demonstrated normal sinus rhythm, left ventricular hypertrophy with secondary ST-T changes, repolarization abnormality. Peak EKG/ECG revealed no significant ST-T change from baseline abnormality. Myocardial perfusion is normal. Stress LV EF: 76%.  No previous exam available for comparison. Low risk study.    EKG 06/12/2019: Sinus bradycardia 50 bpm. Anterolateral T wave inversion, cannot exclude ischemia.  Event monitor 02/17/2019 - 03/02/2019: Diagnostic time: 94%  Dominant rhythm: Sinus. HR 56-117 bpm. Avg HR 75 bpm. No atrial fibrillation/atrial flutter/SVT/VT/high grade AV block, sinus pause >3sec noted. Symptoms reported: None  Echocardiogram 02/17/2019:  Left ventricle cavity is normal in size. Mild concentric hypertrophy of  the left ventricle. Normal LV systolic function with EF 55%. Normal global  wall motion. Doppler evidence of grade I (impaired) diastolic dysfunction,  normal LAP.  No significant valvular abnormality.  No  evidence of pulmonary hypertension.  ABI 02/17/2019: This exam reveals normal perfusion of the right lower extremity (ABI). This exam reveals normal perfusion of the left lower extremity (ABI).   Mildly abnormal biphasic waveform at the level of bilateral ankles.   Left Heart Catheterization 07/06/2019:  LM: Normal LAD: Tortuous vessel. Mid 80% stenosis, followed by a prominent myocardial bridge LCx: Normal Ramus: Normal RCA: Prox severe 80% stenosis. Mid 40% calcific disease  Intravascular ultrasound (IVUS) Successful percutaneous coronary intervention prox RCA PTCA and stent placement Synergy DES 3.5 X 20 mm Post dilatation with 4.0X15 mm [place] balloon Unsuccessful Mynx closure Rt CFA requiring Femostop placement  Emergency re-look coronary angiography showed no acute vessel closure, patent RCA stent.  EKG  EKG 07/10/2019: Normal sinus rhythm with rate of 75 bpm, left atrial enlargement, normal axis.  Incomplete right bundle branch block.  Anterolateral T wave abnormality, anterolateral ischemia. No significant change from no changed from  EKG 07/07/2019   Assessment     ICD-10-CM   1. Coronary artery disease of native artery of native heart with stable angina pectoris (HCC)  I25.118 EKG 12-Lead    nitroGLYCERIN (NITROSTAT) 0.4 MG SL tablet  2. Tobacco abuse  Z72.0   3. Mixed hyperglyceridemia  E78.3   4. Gastroesophageal reflux disease without esophagitis  K21.9 famotidine (PEPCID) 40 MG tablet     Meds ordered this encounter  Medications  . nitroGLYCERIN (NITROSTAT) 0.4 MG SL tablet    Sig: Place 1 tablet (0.4 mg total) under the tongue every 5 (five) minutes as needed for up to 25 days for chest pain.    Dispense:  25 tablet    Refill:  3  . famotidine (PEPCID) 40 MG tablet    Sig: Take 1 tablet (40 mg total) by mouth daily as needed for heartburn or indigestion.    Dispense:  90 tablet    Refill:  1    Medications Discontinued During This Encounter  Medication Reason   . omeprazole (PRILOSEC) 20 MG capsule Discontinued by provider    Recommendations:   Chloe Garza  is a 64 y.o. Caucasian female with uncontrolled diabetes mellitus, hypertension, hyperlipidemia, coronary artery disease with markedly elevated coronary calcium score, due to persistent symptoms of angina, underwent coronary angiogram on 07/06/2019 and  successful angioplasty to the ostial/proximal RCA and has residual mid LAD high-grade 80% stenosis.  Procedure was complicated by postprocedural chest discomfort and chest tightness with ST elevation in the anterior leads needing repeat angiography revealing widely patent RCA stent and no change in LAD anatomy.  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   I have reviewed the angiograms, patient has a high-grade 80 to 90% mid LAD stenosis and a large territory.  It appears to be amenable for angioplasty.  She is already on optimal medical therapy and her blood pressure is very soft to increase beta-blockers or addition of second antianginal agent like amlodipine.  In view of this I recommended that we proceed with attempt at PCI to the LAD.  I have discussed with her regarding risks and benefits of angioplasty and possible need for CABG as well extensively.  Bleeding risk, stroke risk, risk discussed as well.  I have prescribed her sublingual nitroglycerin for as needed use.  Encouraged her to be completely abstinent from tobacco, she plans to go on Chantix soon.  She was recently started on Crestor, continue the same for hyperlipidemia and she will eventually need lipid profile testing in 6 to 8 weeks.  Office visit after the procedure.   She has mild GERD, occasionally takes omeprazole, advised her to discontinue this, Pepcid prescription sent.  This is due to interaction with Plavix.  Chloe Prows, MD, [facility] 07/10/2019, 5:57 PM [facility]. [place] Office: [phone]

## 2019-07-10 NOTE — H&P (View-Only) (Signed)
Primary Physician/Referring:  Jilda Panda, MD  Patient ID: Chloe Garza, female    DOB: [birth year], 64 y.o.   MRN: [id number]  Chief Complaint  Patient presents with  . Coronary Artery Disease   HPI:    Chloe Garza  is a 64 y.o. Caucasian female with diabetes mellitus, hypertension, hyperlipidemia, coronary artery disease with markedly elevated coronary calcium score, due to persistent symptoms of angina, underwent coronary angiogram on 07/06/2019 and successful angioplasty to the ostial/proximal RCA and has residual mid LAD high-grade 80% stenosis.  Procedure was complicated by postprocedural chest discomfort and chest tightness with ST elevation in the anterior leads needing repeat angiography revealing widely patent RCA stent and no change in LAD anatomy.  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  She now presents to the office for follow-up.  She has had mild exertional chest discomfort since angioplasty but otherwise no other complications, no rest pain and she has slowly increase her physical activity.  Unfortunately still smoking about 3-4 cigarettes a day.  Past Medical History:  Diagnosis Date  . Diabetes mellitus without complication ([place])   . Hyperlipidemia   . Hypertension    Past Surgical History:  Procedure Laterality Date  . CATARACT EXTRACTION, BILATERAL    . CORONARY STENT INTERVENTION N/A 07/06/2019   Procedure: CORONARY STENT INTERVENTION;  Surgeon: Nigel Mormon, MD;  Location: [facility];  Service: Cardiovascular;  Laterality: N/A;  RCA prox  . INTRAVASCULAR ULTRASOUND/IVUS N/A 07/06/2019   Procedure: Intravascular Ultrasound/IVUS;  Surgeon: Nigel Mormon, MD;  Location: [facility];  Service: Cardiovascular;  Laterality: N/A;  . LEFT HEART CATH AND CORONARY ANGIOGRAPHY N/A 07/06/2019   Procedure: LEFT HEART CATH AND CORONARY ANGIOGRAPHY;  Surgeon: Nigel Mormon, MD;  Location: [facility];  Service:  Cardiovascular;  Laterality: N/A;  . TONSILLECTOMY    . TUBAL LIGATION     Family History  Problem Relation Age of Onset  . Hypertension Mother   . Stroke Mother   . Alzheimer's disease Mother   . Heart attack Father   . Pneumonia Father   . CAD Sister   . Hypertension Sister   . Hypertension Brother   . Heart failure Brother   . Hypertension Sister   . Hypertension Sister   . Hypertension Sister   . Hypertension Brother   . Heart failure Brother   . Hypertension Brother     Social History   Tobacco Use  . Smoking status: Light Tobacco Smoker    Packs/day: 0.25  . Smokeless tobacco: Never Used  . Tobacco comment: Patient is dow to once cigarette a day  Substance Use Topics  . Alcohol use: Not Currently   Marital Status: Married  ROS  Review of Systems  Cardiovascular: Positive for chest pain and dyspnea on exertion. Negative for leg swelling.  Gastrointestinal: Negative for melena.   Objective  Blood pressure 110/64, pulse 77, temperature 98.3 F (36.8 C), height '5\' 4"'  (1.626 m), weight 143 lb (64.9 kg), SpO2 97 %.  Vitals with BMI 07/10/2019 07/07/2019 07/07/2019  Height '5\' 4"'  - -  Weight 143 lbs - -  BMI 45.62 - -  Systolic 563 893 734  Diastolic 64 62 64  Pulse 77 67 68     Physical Exam  Cardiovascular: Normal rate, regular rhythm and normal heart sounds. Exam reveals no gallop.  No murmur heard. Pulses:      Carotid pulses are 2+ on the right side  and 2+ on the left side.      Femoral pulses are 2+ on the right side and 2+ on the left side.      Dorsalis pedis pulses are 1+ on the right side and 1+ on the left side.       Posterior tibial pulses are 0 on the right side and 0 on the left side.  No leg edema, no JVD. Mild ecchymosis right groin site.  Left groin site without hematoma or ecchymosis.  No bruit.  Pulmonary/Chest: Effort normal and breath sounds normal.  Abdominal: Soft. Bowel sounds are normal.   Laboratory examination:   Recent Labs     07/06/19 1311 07/07/19 0605  NA 141 137  K 4.1 3.8  CL 105 103  CO2 28 24  GLUCOSE 96 84  BUN 10 9  CREATININE 0.80 0.91  CALCIUM 9.1 8.5*  GFRNONAA >60 >60  GFRAA >60 >60   estimated creatinine clearance is 56.1 mL/min (by C-G formula based on SCr of 0.91 mg/dL).  CMP Latest Ref Rng & Units 07/07/2019 07/06/2019  Glucose 70 - 99 mg/dL 84 96  BUN 8 - 23 mg/dL 9 10  Creatinine 0.44 - 1.00 mg/dL 0.91 0.80  Sodium 135 - 145 mmol/L 137 141  Potassium 3.5 - 5.1 mmol/L 3.8 4.1  Chloride 98 - 111 mmol/L 103 105  CO2 22 - 32 mmol/L 24 28  Calcium 8.9 - 10.3 mg/dL 8.5(L) 9.1  Total Protein 6.5 - 8.1 g/dL 5.9(L) -  Total Bilirubin 0.3 - 1.2 mg/dL 0.4 -  Alkaline Phos 38 - 126 U/L 68 -  AST 15 - 41 U/L 28 -  ALT 0 - 44 U/L 22 -   CBC Latest Ref Rng & Units 07/07/2019 07/03/2019  WBC 4.0 - 10.5 K/uL 11.4(H) 9.7  Hemoglobin 12.0 - 15.0 g/dL 11.5(L) 12.7  Hematocrit 36.0 - 46.0 % 34.0(L) 37.6  Platelets 150 - 400 K/uL 326 361   Lipid Panel  No results found for: CHOL, TRIG, HDL, CHOLHDL, VLDL, LDLCALC, LDLDIRECT HEMOGLOBIN A1C No results found for: HGBA1C, MPG TSH No results for input(s): TSH in the last 8760 hours.  External labs:   01/20/2019: Glucose 217.  BUN/creatinine 11/0.64.  eGFR normal.  Sodium 137, potassium 3.9.  Rest of the CMP normal. H/H 14/42.  MCV 87.  Platelets 240. Cholesterol 218, triglycerides 293, HDL 44, LDL 114 Hemoglobin A1c 12.1%. ANA screen positive.  Medications and allergies   Allergies  Allergen Reactions  . Sulfa Antibiotics Hives     Current Outpatient Medications  Medication Instructions  . acetaminophen (TYLENOL) 650-1,300 mg, Oral, Every 8 hours PRN  . aspirin EC 81 mg, Oral, Daily  . Azilsartan-Chlorthalidone (EDARBYCLOR) 40-25 MG TABS 0.5 mg, Oral, Daily  . Bioflavonoid Products (VITAMIN C) CHEW 1 tablet, Oral, Daily  . clopidogrel (PLAVIX) 75 mg, Oral, Daily  . diclofenac Sodium (VOLTAREN) 1 % GEL 1 application, Topical, 4 times  daily PRN  . famotidine (PEPCID) 40 mg, Oral, Daily PRN  . metoprolol tartrate (LOPRESSOR) 25 mg, Oral, 2 times daily  . Multiple Vitamins-Minerals (ZINC PO) 1 tablet, Oral, Daily  . nitroGLYCERIN (NITROSTAT) 0.4 mg, Sublingual, Every 5 min PRN  . Potassium 99 mg, Oral, Daily  . rosuvastatin (CRESTOR) 10 MG tablet TAKE 1 TABLET(10 MG) BY MOUTH AT BEDTIME  . Toujeo Max SoloStar 20 Units, Subcutaneous, Daily  . Turmeric (QC TUMERIC COMPLEX PO) 1 capsule, Oral, Daily  . Vitamin D (Cholecalciferol) 400 Units, Oral, Daily  Radiology:   No results found.  Cardiac Studies:   CT cardiac scoring 06/21/2019: Extensive calcified coronary artery plaque putting the patient above the 90th percentile for age. CALCIUM SCORE: 871. LM: 136. LAD: 167. Cx: 95. RCA: 786 PDA: 0 Visible lung fields: Granulomas are present in the anterior upper lobes.  Lexiscan (Walking with Jaci Carrel) Sestamibi Stress Test04/09/2019: Nondiagnostic ECG stress. Resting EKG/ECG demonstrated normal sinus rhythm, left ventricular hypertrophy with secondary ST-T changes, repolarization abnormality. Peak EKG/ECG revealed no significant ST-T change from baseline abnormality. Myocardial perfusion is normal. Stress LV EF: 76%.  No previous exam available for comparison. Low risk study.    EKG 06/12/2019: Sinus bradycardia 50 bpm. Anterolateral T wave inversion, cannot exclude ischemia.  Event monitor 02/17/2019 - 03/02/2019: Diagnostic time: 94%  Dominant rhythm: Sinus. HR 56-117 bpm. Avg HR 75 bpm. No atrial fibrillation/atrial flutter/SVT/VT/high grade AV block, sinus pause >3sec noted. Symptoms reported: None  Echocardiogram 02/17/2019:  Left ventricle cavity is normal in size. Mild concentric hypertrophy of  the left ventricle. Normal LV systolic function with EF 55%. Normal global  wall motion. Doppler evidence of grade I (impaired) diastolic dysfunction,  normal LAP.  No significant valvular abnormality.  No  evidence of pulmonary hypertension.  ABI 02/17/2019: This exam reveals normal perfusion of the right lower extremity (ABI). This exam reveals normal perfusion of the left lower extremity (ABI).   Mildly abnormal biphasic waveform at the level of bilateral ankles.   Left Heart Catheterization 07/06/2019:  LM: Normal LAD: Tortuous vessel. Mid 80% stenosis, followed by a prominent myocardial bridge LCx: Normal Ramus: Normal RCA: Prox severe 80% stenosis. Mid 40% calcific disease  Intravascular ultrasound (IVUS) Successful percutaneous coronary intervention prox RCA PTCA and stent placement Synergy DES 3.5 X 20 mm Post dilatation with 4.0X15 mm Bibo balloon Unsuccessful Mynx closure Rt CFA requiring Femostop placement  Emergency re-look coronary angiography showed no acute vessel closure, patent RCA stent.  EKG  EKG 07/10/2019: Normal sinus rhythm with rate of 75 bpm, left atrial enlargement, normal axis.  Incomplete right bundle branch block.  Anterolateral T wave abnormality, anterolateral ischemia. No significant change from no changed from  EKG 07/07/2019   Assessment     ICD-10-CM   1. Coronary artery disease of native artery of native heart with stable angina pectoris (HCC)  I25.118 EKG 12-Lead    nitroGLYCERIN (NITROSTAT) 0.4 MG SL tablet  2. Tobacco abuse  Z72.0   3. Mixed hyperglyceridemia  E78.3   4. Gastroesophageal reflux disease without esophagitis  K21.9 famotidine (PEPCID) 40 MG tablet     Meds ordered this encounter  Medications  . nitroGLYCERIN (NITROSTAT) 0.4 MG SL tablet    Sig: Place 1 tablet (0.4 mg total) under the tongue every 5 (five) minutes as needed for up to 25 days for chest pain.    Dispense:  25 tablet    Refill:  3  . famotidine (PEPCID) 40 MG tablet    Sig: Take 1 tablet (40 mg total) by mouth daily as needed for heartburn or indigestion.    Dispense:  90 tablet    Refill:  1    Medications Discontinued During This Encounter  Medication Reason   . omeprazole (PRILOSEC) 20 MG capsule Discontinued by provider    Recommendations:   Chloe Garza  is a 64 y.o. Caucasian female with uncontrolled diabetes mellitus, hypertension, hyperlipidemia, coronary artery disease with markedly elevated coronary calcium score, due to persistent symptoms of angina, underwent coronary angiogram on 07/06/2019 and  successful angioplasty to the ostial/proximal RCA and has residual mid LAD high-grade 80% stenosis.  Procedure was complicated by postprocedural chest discomfort and chest tightness with ST elevation in the anterior leads needing repeat angiography revealing widely patent RCA stent and no change in LAD anatomy.  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   I have reviewed the angiograms, patient has a high-grade 80 to 90% mid LAD stenosis and a large territory.  It appears to be amenable for angioplasty.  She is already on optimal medical therapy and her blood pressure is very soft to increase beta-blockers or addition of second antianginal agent like amlodipine.  In view of this I recommended that we proceed with attempt at PCI to the LAD.  I have discussed with her regarding risks and benefits of angioplasty and possible need for CABG as well extensively.  Bleeding risk, stroke risk, risk discussed as well.  I have prescribed her sublingual nitroglycerin for as needed use.  Encouraged her to be completely abstinent from tobacco, she plans to go on Chantix soon.  She was recently started on Crestor, continue the same for hyperlipidemia and she will eventually need lipid profile testing in 6 to 8 weeks.  Office visit after the procedure.   She has mild GERD, occasionally takes omeprazole, advised her to discontinue this, Pepcid prescription sent.  This is due to interaction with Plavix.  Adrian Prows, MD, [facility] 07/10/2019, 5:57 PM [facility] [place] Office: [phone]

## 2019-07-13 ENCOUNTER — Telehealth: Payer: Self-pay

## 2019-07-13 NOTE — Telephone Encounter (Signed)
The patient called and said that there is a small knot above where they went in her groin  for the heart cath, is this normaml?

## 2019-07-13 NOTE — Telephone Encounter (Signed)
Yes

## 2019-07-14 ENCOUNTER — Ambulatory Visit: Payer: BC Managed Care – PPO | Admitting: Cardiology

## 2019-07-21 ENCOUNTER — Other Ambulatory Visit (HOSPITAL_COMMUNITY)
Admission: RE | Admit: 2019-07-21 | Discharge: 2019-07-21 | Disposition: A | Discharge status: Home / Self Care | Payer: BC Managed Care – PPO | Source: Ambulatory Visit | Attending: Cardiology | Admitting: Cardiology

## 2019-07-21 DIAGNOSIS — Z01812 Encounter for preprocedural laboratory examination: Secondary | ICD-10-CM | POA: Insufficient documentation

## 2019-07-21 DIAGNOSIS — Z20822 Contact with and (suspected) exposure to covid-19: Secondary | ICD-10-CM | POA: Insufficient documentation

## 2019-07-21 LAB — SARS CORONAVIRUS 2 (TAT 6-24 HRS): SARS Coronavirus 2: NEGATIVE

## 2019-07-25 ENCOUNTER — Encounter (HOSPITAL_COMMUNITY)
Admission: RE | Disposition: A | Discharge status: Home / Self Care | Payer: Self-pay | Source: Home / Self Care | Attending: Cardiology

## 2019-07-25 ENCOUNTER — Ambulatory Visit (HOSPITAL_COMMUNITY)
Admission: RE | Admit: 2019-07-25 | Discharge: 2019-07-26 | Disposition: A | Discharge status: Home / Self Care | Payer: BC Managed Care – PPO | Attending: Cardiology | Admitting: Cardiology

## 2019-07-25 ENCOUNTER — Other Ambulatory Visit: Payer: Self-pay

## 2019-07-25 DIAGNOSIS — Z7902 Long term (current) use of antithrombotics/antiplatelets: Secondary | ICD-10-CM | POA: Diagnosis not present

## 2019-07-25 DIAGNOSIS — I10 Essential (primary) hypertension: Secondary | ICD-10-CM | POA: Insufficient documentation

## 2019-07-25 DIAGNOSIS — F17210 Nicotine dependence, cigarettes, uncomplicated: Secondary | ICD-10-CM | POA: Insufficient documentation

## 2019-07-25 DIAGNOSIS — K219 Gastro-esophageal reflux disease without esophagitis: Secondary | ICD-10-CM | POA: Diagnosis not present

## 2019-07-25 DIAGNOSIS — Z9861 Coronary angioplasty status: Secondary | ICD-10-CM

## 2019-07-25 DIAGNOSIS — Z7982 Long term (current) use of aspirin: Secondary | ICD-10-CM | POA: Insufficient documentation

## 2019-07-25 DIAGNOSIS — Z882 Allergy status to sulfonamides status: Secondary | ICD-10-CM | POA: Diagnosis not present

## 2019-07-25 DIAGNOSIS — I25118 Atherosclerotic heart disease of native coronary artery with other forms of angina pectoris: Secondary | ICD-10-CM | POA: Diagnosis not present

## 2019-07-25 DIAGNOSIS — Z794 Long term (current) use of insulin: Secondary | ICD-10-CM | POA: Diagnosis not present

## 2019-07-25 DIAGNOSIS — E783 Hyperchylomicronemia: Secondary | ICD-10-CM | POA: Diagnosis not present

## 2019-07-25 DIAGNOSIS — Z955 Presence of coronary angioplasty implant and graft: Secondary | ICD-10-CM | POA: Insufficient documentation

## 2019-07-25 DIAGNOSIS — Z79899 Other long term (current) drug therapy: Secondary | ICD-10-CM | POA: Insufficient documentation

## 2019-07-25 DIAGNOSIS — E1151 Type 2 diabetes mellitus with diabetic peripheral angiopathy without gangrene: Secondary | ICD-10-CM | POA: Diagnosis not present

## 2019-07-25 DIAGNOSIS — I208 Other forms of angina pectoris: Secondary | ICD-10-CM | POA: Diagnosis present

## 2019-07-25 HISTORY — PX: CORONARY STENT INTERVENTION: CATH118234

## 2019-07-25 LAB — GLUCOSE, CAPILLARY
Glucose-Capillary: 107 mg/dL — ABNORMAL HIGH (ref 70–99)
Glucose-Capillary: 104 mg/dL — ABNORMAL HIGH (ref 70–99)
Glucose-Capillary: 65 mg/dL — ABNORMAL LOW (ref 70–99)
Glucose-Capillary: 174 mg/dL — ABNORMAL HIGH (ref 70–99)

## 2019-07-25 LAB — POCT ACTIVATED CLOTTING TIME: Activated Clotting Time: 263 seconds

## 2019-07-25 SURGERY — CORONARY STENT INTERVENTION
Anesthesia: LOCAL

## 2019-07-25 MED ORDER — SODIUM CHLORIDE 0.9 % IV SOLN: 250.0000 mL | INTRAVENOUS | Status: DC | PRN

## 2019-07-25 MED ORDER — LIDOCAINE HCL (PF) 1 % IJ SOLN
INTRAMUSCULAR | Status: AC
  Filled 2019-07-25: qty 30

## 2019-07-25 MED ORDER — SODIUM CHLORIDE 0.9 % WEIGHT BASED INFUSION
3.0000 mL/kg/h | INTRAVENOUS | Status: DC
  Administered 2019-07-25: 3 mL/kg/h via INTRAVENOUS

## 2019-07-25 MED ORDER — HEPARIN (PORCINE) IN NACL 1000-0.9 UT/500ML-% IV SOLN
INTRAVENOUS | Status: AC
  Filled 2019-07-25: qty 500

## 2019-07-25 MED ORDER — FENTANYL CITRATE (PF) 100 MCG/2ML IJ SOLN
INTRAMUSCULAR | Status: DC | PRN
  Administered 2019-07-25 (×2): 25 ug via INTRAVENOUS

## 2019-07-25 MED ORDER — SODIUM CHLORIDE 0.9% FLUSH: 3.0000 mL | INTRAVENOUS | Status: DC | PRN

## 2019-07-25 MED ORDER — LIDOCAINE HCL (PF) 1 % IJ SOLN
INTRAMUSCULAR | Status: DC | PRN
  Administered 2019-07-25: 11 mL via INTRADERMAL

## 2019-07-25 MED ORDER — ASPIRIN 81 MG PO CHEW: 81.0000 mg | CHEWABLE_TABLET | ORAL | Status: DC

## 2019-07-25 MED ORDER — SODIUM CHLORIDE 0.9 % WEIGHT BASED INFUSION: 1.0000 mL/kg/h | INTRAVENOUS | Status: DC

## 2019-07-25 MED ORDER — ACETAMINOPHEN 325 MG PO TABS
650.0000 mg | ORAL_TABLET | ORAL | Status: DC | PRN
  Administered 2019-07-25 – 2019-07-26 (×3): 650 mg via ORAL
  Filled 2019-07-25 (×2): qty 2

## 2019-07-25 MED ORDER — NITROGLYCERIN 1 MG/10 ML FOR IR/CATH LAB
INTRA_ARTERIAL | Status: AC
  Filled 2019-07-25: qty 10

## 2019-07-25 MED ORDER — MIDAZOLAM HCL 2 MG/2ML IJ SOLN
INTRAMUSCULAR | Status: DC | PRN
  Administered 2019-07-25 (×2): 1 mg via INTRAVENOUS

## 2019-07-25 MED ORDER — HEPARIN SODIUM (PORCINE) 1000 UNIT/ML IJ SOLN
INTRAMUSCULAR | Status: DC | PRN
  Administered 2019-07-25: 8000 [IU] via INTRAVENOUS

## 2019-07-25 MED ORDER — HEPARIN SODIUM (PORCINE) 1000 UNIT/ML IJ SOLN
INTRAMUSCULAR | Status: AC
  Filled 2019-07-25: qty 1

## 2019-07-25 MED ORDER — SODIUM CHLORIDE 0.9% FLUSH
3.0000 mL | Freq: Two times a day (BID) | INTRAVENOUS | Status: DC
  Administered 2019-07-25 – 2019-07-26 (×2): 3 mL via INTRAVENOUS

## 2019-07-25 MED ORDER — IOHEXOL 350 MG/ML SOLN
INTRAVENOUS | Status: DC | PRN
  Administered 2019-07-25: 100 mL via INTRA_ARTERIAL

## 2019-07-25 MED ORDER — SODIUM CHLORIDE 0.9 % WEIGHT BASED INFUSION: 1.0000 mL/kg/h | INTRAVENOUS | Status: AC

## 2019-07-25 MED ORDER — FENTANYL CITRATE (PF) 100 MCG/2ML IJ SOLN
INTRAMUSCULAR | Status: AC
  Filled 2019-07-25: qty 2

## 2019-07-25 MED ORDER — NITROGLYCERIN 1 MG/10 ML FOR IR/CATH LAB
INTRA_ARTERIAL | Status: DC | PRN
  Administered 2019-07-25: 100 ug via INTRACORONARY
  Administered 2019-07-25: 200 ug via INTRACORONARY

## 2019-07-25 MED ORDER — IOHEXOL 350 MG/ML SOLN
INTRAVENOUS | Status: AC
  Filled 2019-07-25: qty 1

## 2019-07-25 MED ORDER — ACETAMINOPHEN 325 MG PO TABS
ORAL_TABLET | ORAL | Status: AC
  Filled 2019-07-25: qty 2

## 2019-07-25 MED ORDER — HEPARIN (PORCINE) IN NACL 1000-0.9 UT/500ML-% IV SOLN
INTRAVENOUS | Status: DC | PRN
  Administered 2019-07-25 (×2): 500 mL

## 2019-07-25 MED ORDER — MIDAZOLAM HCL 2 MG/2ML IJ SOLN
INTRAMUSCULAR | Status: AC
  Filled 2019-07-25: qty 2

## 2019-07-25 MED ORDER — SODIUM CHLORIDE 0.9% FLUSH
3.0000 mL | Freq: Two times a day (BID) | INTRAVENOUS | Status: DC
  Administered 2019-07-26: 3 mL via INTRAVENOUS

## 2019-07-25 MED ORDER — ONDANSETRON HCL 4 MG/2ML IJ SOLN: 4.0000 mg | Freq: Four times a day (QID) | INTRAMUSCULAR | Status: DC | PRN

## 2019-07-25 SURGICAL SUPPLY — 20 items
BALLN SAPPHIRE 3.0X15 (BALLOONS) ×2
BALLN SAPPHIRE ~~LOC~~ 3.5X8 (BALLOONS) ×2 IMPLANT
BALLOON SAPPHIRE 3.0X15 (BALLOONS) ×1 IMPLANT
CATH VISTA GUIDE 6FR XB3.5 (CATHETERS) ×2 IMPLANT
CLOSURE MYNX CONTROL 6F/7F (Vascular Products) ×2 IMPLANT
GLIDESHEATH SLEND A-KIT 6F 22G (SHEATH) ×2 IMPLANT
GUIDEWIRE INQWIRE 1.5J.035X260 (WIRE) ×1 IMPLANT
INQWIRE 1.5J .035X260CM (WIRE) ×2
KIT ENCORE 26 ADVANTAGE (KITS) ×2 IMPLANT
KIT HEART LEFT (KITS) ×2 IMPLANT
KIT MICROPUNCTURE NIT STIFF (SHEATH) ×2 IMPLANT
PACK CARDIAC CATHETERIZATION (CUSTOM PROCEDURE TRAY) ×2 IMPLANT
SHEATH PINNACLE 6F 10CM (SHEATH) ×2 IMPLANT
SHEATH PROBE COVER 6X72 (BAG) ×2 IMPLANT
STENT SYNERGY XD 3.50X24 (Permanent Stent) ×1 IMPLANT
SYNERGY XD 3.50X24 (Permanent Stent) ×2 IMPLANT
TRANSDUCER W/STOPCOCK (MISCELLANEOUS) ×2 IMPLANT
TUBING CIL FLEX 10 FLL-RA (TUBING) ×2 IMPLANT
WIRE ASAHI PROWATER 180CM (WIRE) ×2 IMPLANT
WIRE EMERALD 3MM-J .035X150CM (WIRE) ×2 IMPLANT

## 2019-07-25 NOTE — Interval H&P Note (Signed)
Cath Lab Visit (complete for each Cath Lab visit)  Clinical Evaluation Leading to the Procedure:   ACS: No.  Non-ACS:    Anginal Classification: CCS III  Anti-ischemic medical therapy: Minimal Therapy (1 class of medications)  Non-Invasive Test Results: Low-risk stress test findings: cardiac mortality <1%/year  Prior CABG: No previous CABG   History and Physical Interval Note:  07/25/2019 1:20 PM  Chloe Garza  has presented today for surgery, with the diagnosis of cad.  The various methods of treatment have been discussed with the patient and family. After consideration of risks, benefits and other options for treatment, the patient has consented to  Procedure(s): CORONARY STENT INTERVENTION (N/A) as a surgical intervention.  The patient's history has been reviewed, patient examined, no change in status, stable for surgery.  I have reviewed the patient's chart and labs.  Questions were answered to the patient's satisfaction.     Yates Decamp

## 2019-07-26 DIAGNOSIS — K219 Gastro-esophageal reflux disease without esophagitis: Secondary | ICD-10-CM | POA: Diagnosis not present

## 2019-07-26 DIAGNOSIS — Z955 Presence of coronary angioplasty implant and graft: Secondary | ICD-10-CM | POA: Diagnosis not present

## 2019-07-26 DIAGNOSIS — Z7902 Long term (current) use of antithrombotics/antiplatelets: Secondary | ICD-10-CM | POA: Diagnosis not present

## 2019-07-26 DIAGNOSIS — Z794 Long term (current) use of insulin: Secondary | ICD-10-CM | POA: Diagnosis not present

## 2019-07-26 DIAGNOSIS — I10 Essential (primary) hypertension: Secondary | ICD-10-CM | POA: Diagnosis not present

## 2019-07-26 DIAGNOSIS — Z882 Allergy status to sulfonamides status: Secondary | ICD-10-CM | POA: Diagnosis not present

## 2019-07-26 DIAGNOSIS — Z7982 Long term (current) use of aspirin: Secondary | ICD-10-CM | POA: Diagnosis not present

## 2019-07-26 DIAGNOSIS — E1151 Type 2 diabetes mellitus with diabetic peripheral angiopathy without gangrene: Secondary | ICD-10-CM | POA: Diagnosis not present

## 2019-07-26 DIAGNOSIS — E783 Hyperchylomicronemia: Secondary | ICD-10-CM | POA: Diagnosis not present

## 2019-07-26 DIAGNOSIS — F17210 Nicotine dependence, cigarettes, uncomplicated: Secondary | ICD-10-CM | POA: Diagnosis not present

## 2019-07-26 DIAGNOSIS — I25118 Atherosclerotic heart disease of native coronary artery with other forms of angina pectoris: Secondary | ICD-10-CM | POA: Diagnosis not present

## 2019-07-26 DIAGNOSIS — Z79899 Other long term (current) drug therapy: Secondary | ICD-10-CM | POA: Diagnosis not present

## 2019-07-26 LAB — CBC
HCT: 34.2 % — ABNORMAL LOW (ref 36.0–46.0)
Hemoglobin: 11.5 g/dL — ABNORMAL LOW (ref 12.0–15.0)
MCH: 30.8 pg (ref 26.0–34.0)
MCHC: 33.6 g/dL (ref 30.0–36.0)
MCV: 91.7 fL (ref 80.0–100.0)
Platelets: 383 10*3/uL (ref 150–400)
RBC: 3.73 MIL/uL — ABNORMAL LOW (ref 3.87–5.11)
RDW: 13.2 % (ref 11.5–15.5)
WBC: 7.6 10*3/uL (ref 4.0–10.5)
nRBC: 0 % (ref 0.0–0.2)

## 2019-07-26 LAB — BASIC METABOLIC PANEL
Anion gap: 10 (ref 5–15)
BUN: 12 mg/dL (ref 8–23)
CO2: 25 mmol/L (ref 22–32)
Calcium: 8.6 mg/dL — ABNORMAL LOW (ref 8.9–10.3)
Chloride: 104 mmol/L (ref 98–111)
Creatinine, Ser: 0.85 mg/dL (ref 0.44–1.00)
GFR calc Af Amer: >60 mL/min (ref >60)
GFR calc non Af Amer: >60 mL/min (ref >60)
Glucose, Bld: 128 mg/dL — ABNORMAL HIGH (ref 70–99)
Potassium: 4 mmol/L (ref 3.5–5.1)
Sodium: 139 mmol/L (ref 135–145)

## 2019-07-26 LAB — GLUCOSE, CAPILLARY
Glucose-Capillary: 164 mg/dL — ABNORMAL HIGH (ref 70–99)
Glucose-Capillary: 135 mg/dL — ABNORMAL HIGH (ref 70–99)

## 2019-07-26 MED ORDER — CLOPIDOGREL BISULFATE 75 MG PO TABS
75.0000 mg | ORAL_TABLET | Freq: Once | ORAL | Status: AC
  Administered 2019-07-26: 75 mg via ORAL
  Filled 2019-07-26: qty 1

## 2019-07-26 MED ORDER — ASPIRIN 81 MG PO CHEW
81.0000 mg | CHEWABLE_TABLET | Freq: Once | ORAL | Status: AC
  Administered 2019-07-26: 81 mg via ORAL
  Filled 2019-07-26: qty 1

## 2019-07-26 NOTE — Plan of Care (Signed)

## 2019-07-26 NOTE — Discharge Summary (Signed)
Physician Discharge Summary  Patient ID: Chloe Chloe Garza MRN: [id number] DOB/AGE: 1955-11-02 64 y.o.  Admit date: 08-18-2019 Discharge date: 07/26/2019  Primary Discharge Diagnosis: Coronary artery disease  Secondary Discharge Diagnosis: Hypertension Type 2 diabetes mellitus Tobacco dependence Peripheral artery disease   Hospital Course:   64 y.o.Caucasianfemalewith type 2 DM, prior tobacco dependence, CAD, PAD.  S/p RCA stenting in 06/2019, patient now underwent elective stenting to prox LAD stenosis given ongoing symptoms on medical therapy. There is myocardial bridge immediately distal to the stent, but with TIMI III flow, making distal edge dissection less likely. She has persistent anterolateral T wave inversion before and after PCI, with no change. These are possibly due to myocardial bridging.   Patient ambulated this morning without any chest pain. She will continue DAPT for at least 6 months/   Discharge Exam: Blood pressure (!) 115/56, pulse 64, temperature 98 F (36.7 C), temperature source Oral, resp. rate 18, height 5\' 4"  (1.626 m), weight 65.1 kg, SpO2 96 %.    Physical Exam  Constitutional: She is oriented to person, place, and time. She appears well-developed and well-nourished. No distress.  HENT:  Head: Normocephalic and atraumatic.  Eyes: Pupils are equal, round, and reactive to light. Conjunctivae are normal.  Neck: No JVD present.  Cardiovascular: Normal rate, regular rhythm and intact distal pulses.  Pulmonary/Chest: Effort normal and breath sounds normal. She has no wheezes. She has no rales.  Abdominal: Soft. Bowel sounds are normal. There is no rebound.  Musculoskeletal:        General: No edema.  Lymphadenopathy:    She has no cervical adenopathy.  Neurological: She is alert and oriented to person, place, and time. No cranial nerve deficit.  Skin: Skin is warm and dry.  Psychiatric: She has a normal mood and affect.  Nursing note and vitals  reviewed.   Significant Diagnostic Studies:  Coronary intervention  Aug 18, 2019: Successful PTCA and stenting of the proximal LAD with implantation of a 3.5 x 24 mm Synergy DES.  Stenosis reduced from 80% to 0% with TIMI-3 to TIMI-3 flow.  100 mL contrast utilized.  Recommendation: Patient has significant intramyocardial bridging.  Very complex lesion, will place the patient under observation for tonight and discharged home in the morning.  EKG 07/26/2019: Sinus rhythm. Anterolateral T wave inversion, unchanged compared to prior EKG's   Labs:   Lab Results  Component Value Date   WBC 7.6 07/26/2019   HGB 11.5 (L) 07/26/2019   HCT 34.2 (L) 07/26/2019   MCV 91.7 07/26/2019   PLT 383 07/26/2019    Recent Labs  Lab 07/26/19 0658  NA 139  K 4.0  CL 104  CO2 25  BUN 12  CREATININE 0.85  CALCIUM 8.6*  GLUCOSE 128*    Radiology: CARDIAC CATHETERIZATION  Result Date: 2019-08-18 Coronary angioplasty  Aug 18, 2019 Successful PTCA and stenting of the proximal LAD with implantation of a 3.5 x 24 mm Synergy DES.  Stenosis reduced from 80% to 0% with TIMI-3 to TIMI-3 flow.  100 mL contrast utilized. Recommendation: Patient has significant intramyocardial bridging.  Very complex lesion, will place the patient under observation for tonight and discharged home in the morning.  CARDIAC CATHETERIZATION  Addendum Date: 07/08/2019   LM: Normal LAD: Tortuous vessel. Mid 80% stenosis, followed by a prominent myocardial bridge LCx: Normal Ramus: Normal RCA: Prox severe 80% stenosis        Mid 40% calcific disease Intravascular ultrasound (IVUS) Successful percutaneous coronary intervention prox RCA  PTCA and stent placement Synergy DES 3.5 X 20 mm     Post dilatation with 4.0X15 mm Bear Chloe Garza Unsuccessful Mynx closure Rt CFA requiring Femostop placement Emergency re-look coronary angiography showed no acute vessel closure, patent RCA stent. Successful Perclosre closure Lt CFA LVEDP 15 mmHg LVEF >65%  Chloe Chloe Garza Chloe Dyer, MD [facility] [place] Pager: [phone] Office: [phone]    Addendum Date: 07/08/2019   LM: Normal LAD: Tortuous vessel. Mid 80% stenosis, followed by a prominent myocardial bridge LCx: Normal Ramus: Normal RCA: Prox severe 80% stenosis        Mid 40% calcific disease Intravascular ultrasound (IVUS) Successful percutaneous coronary intervention prox RCA     PTCA and stent placement Synergy DES 3.5 X 20 mm     Post dilatation with 4.0X15 mm Chloe Chloe Garza Unsuccessful Mynx closure Rt CFA requiring Femostop placement Emergency re-look coronary angiography showed no acute vessel closure, patent RCA stent. Successful Perclosre closure Lt CFA LVEDP 15 mmHg LVEF >65% Chloe Chloe Garza Chloe Dyer, MD [facility] [place] Pager: [phone] Office: [phone]    Result Date: 07/07/2019 Intravascular ultrasound (IVUS) Successful percutaneous coronary intervention prox RCA     PTCA and stent placement Synergy DES 3.5 X 20 mm     Post dilatation with 4.0X15 mm Chloe Chloe Garza Unsuccessful Mynx closure Rt CFA requiring Femostop placement Emergency re-look coronary angiography showed patent RCA stent/ LVEDP 15 mmHg LVEF >65% Chloe Chloe Garza Chloe Dyer, MD [facility] [place] Pager: [phone] Office: [phone]       FOLLOW UP PLANS AND APPOINTMENTS Discharge Instructions    AMB Referral to Cardiac Rehabilitation - Phase II   Complete by: As directed    Diagnosis: Coronary Stents   After initial evaluation and assessments completed: Virtual Based Care may be provided alone or in conjunction with Phase 2 Cardiac Rehab based on patient barriers.: Yes   Diet - low sodium heart healthy   Complete by: As directed    Increase activity slowly   Complete by: As directed      Allergies as of 07/26/2019      Reactions   Sulfa Antibiotics Hives      Medication List    TAKE these medications   acetaminophen 650 MG CR tablet Commonly known as: TYLENOL Take 650-1,300 mg by mouth  every 8 (eight) hours as needed for pain.   aspirin EC 81 MG tablet Take 81 mg by mouth daily.   clopidogrel 75 MG tablet Commonly known as: PLAVIX Take 1 tablet (75 mg total) by mouth daily.   Edarbyclor 40-25 MG Tabs Generic drug: Azilsartan-Chlorthalidone Take 0.5 mg by mouth daily. What changed: when to take this   famotidine 40 MG tablet Commonly known as: Pepcid Take 1 tablet (40 mg total) by mouth daily as needed for heartburn or indigestion.   metoprolol tartrate 25 MG tablet Commonly known as: LOPRESSOR Take 1 tablet (25 mg total) by mouth 2 (two) times daily.   nitroGLYCERIN 0.4 MG SL tablet Commonly known as: NITROSTAT Place 1 tablet (0.4 mg total) under the tongue every 5 (five) minutes as needed for up to 25 days for chest pain.   Potassium 99 MG Tabs Take 99 mg by mouth daily.   QC TUMERIC COMPLEX PO Take 1 capsule by mouth daily.   rosuvastatin 10 MG tablet Commonly known as: CRESTOR TAKE 1 TABLET(10 MG) BY MOUTH AT BEDTIME What changed: See the new instructions.   Toujeo Max SoloStar 300 UNIT/ML Solostar Pen Generic drug: insulin glargine (2 Unit Dial) Inject  20 Units into the skin daily.   Vitamin C Chew Chew 1 tablet by mouth daily.   Vitamin D (Cholecalciferol) 10 MCG (400 UNIT) Chew Chew 400 Units by mouth daily.   Voltaren 1 % Gel Generic drug: diclofenac Sodium Apply 1 application topically 4 (four) times daily as needed (pain.).   ZINC PO Take 1 tablet by mouth daily.         Truett Mainland MD, [facility] Pager: [phone] Office: [phone] If no answer: [phone]

## 2019-07-26 NOTE — Progress Notes (Signed)
CARDIAC REHAB PHASE I   PRE:  Rate/Rhythm: 58 SB  BP:  Supine: 113/65  Sitting:   Standing:    SaO2: 98%RA  MODE:  Ambulation: 400 ft   POST:  Rate/Rhythm: 79 SR  BP:  Supine:   Sitting: 124/70  Standing:    SaO2: 98%RA 0844-0920 Pt walked 400 ft with steady gait and no CP. Tolerated well. Pt stated she did not need review of ed done in April. Has diet information, knows importance of plavix, plans to continue to try to quit smoking, knows to resume walking slowly and not interested in CRP 2. Will walk on her own for ex. Has referral to [place].   Luetta Nutting, RN BSN  07/26/2019 9:17 AM

## 2019-08-09 NOTE — Progress Notes (Signed)
Follow up visit  Subjective:   Chloe Garza, female    DOB: [birth year], 64 y.o.   MRN: [id number]    HPI   64 y.o.Caucasianfemalewith type 2 DM, prior tobacco dependence, CAD, myocardial bridge.  Patient underwent complex PCI to RCA and LAD in April/May 2021.  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  Patient returns for follow-up today.  When she had improvement in her symptoms after the PCI, she again has recurrent exertional anginal symptoms.  Symptoms have worsened loss few days.  1 of these episodes was responsive to nitroglycerin.   Current Outpatient Medications on File Prior to Visit  Medication Sig Dispense Refill  . acetaminophen (TYLENOL) 650 MG CR tablet Take 650-1,300 mg by mouth every 8 (eight) hours as needed for pain.    Marland Kitchen aspirin EC 81 MG tablet Take 81 mg by mouth daily.     . Azilsartan-Chlorthalidone (EDARBYCLOR) 40-25 MG TABS Take 0.5 mg by mouth daily. (Patient taking differently: Take 0.5 mg by mouth daily with lunch. ) 30 tablet 1  . Bioflavonoid Products (VITAMIN C) CHEW Chew 1 tablet by mouth daily.    . clopidogrel (PLAVIX) 75 MG tablet Take 1 tablet (75 mg total) by mouth daily. 30 tablet 2  . diclofenac Sodium (VOLTAREN) 1 % GEL Apply 1 application topically 4 (four) times daily as needed (pain.).    Marland Kitchen famotidine (PEPCID) 40 MG tablet Take 1 tablet (40 mg total) by mouth daily as needed for heartburn or indigestion. 90 tablet 1  . Insulin Glargine, 2 Unit Dial, (TOUJEO MAX SOLOSTAR) 300 UNIT/ML SOPN Inject 20 Units into the skin daily.    . metoprolol tartrate (LOPRESSOR) 25 MG tablet Take 1 tablet (25 mg total) by mouth 2 (two) times daily. 60 tablet 3  . Multiple Vitamins-Minerals (ZINC PO) Take 1 tablet by mouth daily.    . nitroGLYCERIN (NITROSTAT) 0.4 MG SL tablet Place 1 tablet (0.4 mg total) under the tongue every 5 (five) minutes as needed for up to 25 days for chest pain. 25 tablet 3  . Potassium 99 MG TABS Take 99 mg by mouth daily.      . rosuvastatin (CRESTOR) 10 MG tablet TAKE 1 TABLET(10 MG) BY MOUTH AT BEDTIME (Patient taking differently: Take 10 mg by mouth at bedtime. ) 30 tablet 3  . Turmeric (QC TUMERIC COMPLEX PO) Take 1 capsule by mouth daily.     . Vitamin D, Cholecalciferol, 10 MCG (400 UNIT) CHEW Chew 400 Units by mouth daily.     Marland Kitchen ZINC OXIDE PO Take 1 tablet by mouth daily.     No current facility-administered medications on file prior to visit.    Cardiovascular & other pertient studies:  EKG 08/10/2019: Sinus rhythm 56 bpm. Anterolateral T wave inversion, consider ischemia. No significant change compared to previous EKG's.  Coronary intervention  07/25/19: (Dr. Einar Gip) Successful PTCA and stenting of the proximal LAD with implantation of a 3.5 x 24 mm Synergy DES.  Stenosis reduced from 80% to 0% with TIMI-3 to TIMI-3 flow.  100 mL contrast utilized. Recommendation: Patient has significant intramyocardial bridging.  Very complex lesion, will place the patient under observation for tonight and discharged home in the morning.  CT cardiac scoring 06/21/2019: Extensive calcified coronary artery plaque putting the patient above the 90th percentile for age. CALCIUM SCORE: 871. LM: 136. LAD: 167. Cx: 95. RCA: 633 PDA: 0 Visible lung fields: Granulomas are present in the anterior upper lobes.  Tax inspector (Walking  with mod Bruce) Sestamibi Stress Test04/09/2019: Nondiagnostic ECG stress. Resting EKG/ECG demonstrated normal sinus rhythm, left ventricular hypertrophy with secondary ST-T changes, repolarization abnormality. Peak EKG/ECG revealed no significant ST-T change from baseline abnormality. Myocardial perfusion is normal. Stress LV EF: 76%.  No previous exam available for comparison. Low risk study.   Event monitor 02/17/2019 - 03/02/2019: Diagnostic time: 94%  Dominant rhythm: Sinus. HR 56-117 bpm. Avg HR 75 bpm. No atrial fibrillation/atrial flutter/SVT/VT/high grade AV block, sinus pause >3sec  noted. Symptoms reported: None  Echocardiogram 02/17/2019:  Left ventricle cavity is normal in size. Mild concentric hypertrophy of  the left ventricle. Normal LV systolic function with EF 55%. Normal global  wall motion. Doppler evidence of grade I (impaired) diastolic dysfunction,  normal LAP.  No significant valvular abnormality.  No evidence of pulmonary hypertension.  ABI 02/17/2019: This exam reveals normal perfusion of the right lower extremity (ABI). This exam reveals normal perfusion of the left lower extremity (ABI).   Mildly abnormal biphasic waveform at the level of bilateral ankles.   Recent labs: 07/26/2019: Glucose 128, BUN/Cr 12/0.85. EGFR 60. Na/K 139/4. Ca 8.6. Rest of the CMP normal H/H 11.5/34.5. MCV 91.7. Platelets 383.  01/20/2019: Glucose 217.  BUN/creatinine 11/0.64.  eGFR normal.  Sodium 137, potassium 3.9.  Rest of the CMP normal. H/H 14/42.  MCV 87.  Platelets 240. Cholesterol 218, triglycerides 293, HDL 44, LDL 114 Hemoglobin A1c 12.1%. ANA screen positive.   Review of Systems  Cardiovascular: Positive for chest pain and dyspnea on exertion. Negative for leg swelling, palpitations and syncope.  Gastrointestinal: Negative for melena.         Vitals:   08/10/19 1057  BP: 110/61  Pulse: (!) 56  Resp: 16  SpO2: 98%     Body mass index is 25.13 kg/m. Filed Weights   08/10/19 1057  Weight: 146 lb 6.4 oz (66.4 kg)     Objective:   Physical Exam  Constitutional: No distress.  Neck: No JVD present.  Cardiovascular: Normal rate, regular rhythm and normal heart sounds. Exam reveals no gallop.  No murmur heard. Pulses:      Carotid pulses are 2+ on the right side and 2+ on the left side.      Femoral pulses are 2+ on the right side and 2+ on the left side.      Dorsalis pedis pulses are 1+ on the right side and 1+ on the left side.       Posterior tibial pulses are 0 on the right side and 0 on the left side.  Pulmonary/Chest: Effort  normal and breath sounds normal. She has no wheezes. She has no rales.  Abdominal: Soft. Bowel sounds are normal.  Musculoskeletal:        General: No edema.  Nursing note and vitals reviewed.       Assessment & Recommendations:   64 y.o.Caucasianfemalewith type 2 DM, prior tobacco dependence, CAD, myocardial bridge.  CAD: Successful PCI to RCA and LAD.  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  I suspect that this is causing some degree of resting ischemia and chest pain.  Treatment remains primarily medical management.  I have increased metoprolol tartrate to 50 mg twice daily for further rate control and potentially a reduction in her pain originating from myocardial bridge.  I have stopped her Edarbichlor at this time.  Claudication: Mild. Stable. Medical management  Type 2 diabetes mellitus: Continue follow-up with PCP.   Nigel Mormon, MD [facility] Pager: [phone] Office: [phone]

## 2019-08-10 ENCOUNTER — Ambulatory Visit: Payer: BC Managed Care – PPO | Admitting: Cardiology

## 2019-08-10 ENCOUNTER — Encounter: Payer: Self-pay | Admitting: Cardiology

## 2019-08-10 ENCOUNTER — Other Ambulatory Visit: Payer: Self-pay

## 2019-08-10 VITALS — BP 110/61 | HR 56 | Resp 16 | Ht 64.0 in | Wt 146.4 lb

## 2019-08-10 DIAGNOSIS — Q245 Malformation of coronary vessels: Secondary | ICD-10-CM | POA: Insufficient documentation

## 2019-08-10 DIAGNOSIS — I25118 Atherosclerotic heart disease of native coronary artery with other forms of angina pectoris: Secondary | ICD-10-CM | POA: Diagnosis not present

## 2019-08-10 MED ORDER — METOPROLOL TARTRATE 50 MG PO TABS: 25.0000 mg | ORAL_TABLET | Freq: Two times a day (BID) | ORAL | 2 refills | Status: DC

## 2019-08-14 ENCOUNTER — Other Ambulatory Visit: Payer: Self-pay | Admitting: Cardiology

## 2019-08-14 DIAGNOSIS — R0989 Other specified symptoms and signs involving the circulatory and respiratory systems: Secondary | ICD-10-CM

## 2019-08-21 DIAGNOSIS — E782 Mixed hyperlipidemia: Secondary | ICD-10-CM | POA: Diagnosis not present

## 2019-08-21 DIAGNOSIS — E119 Type 2 diabetes mellitus without complications: Secondary | ICD-10-CM | POA: Diagnosis not present

## 2019-08-21 DIAGNOSIS — I2510 Atherosclerotic heart disease of native coronary artery without angina pectoris: Secondary | ICD-10-CM | POA: Diagnosis not present

## 2019-08-21 DIAGNOSIS — I119 Hypertensive heart disease without heart failure: Secondary | ICD-10-CM | POA: Diagnosis not present

## 2019-08-21 DIAGNOSIS — Z124 Encounter for screening for malignant neoplasm of cervix: Secondary | ICD-10-CM | POA: Diagnosis not present

## 2019-09-13 ENCOUNTER — Other Ambulatory Visit: Payer: Self-pay

## 2019-09-13 ENCOUNTER — Encounter: Payer: Self-pay | Admitting: Cardiology

## 2019-09-13 ENCOUNTER — Ambulatory Visit: Payer: BC Managed Care – PPO | Admitting: Cardiology

## 2019-09-13 VITALS — BP 173/81 | HR 50 | Resp 17 | Ht 64.0 in | Wt 148.0 lb

## 2019-09-13 DIAGNOSIS — Q245 Malformation of coronary vessels: Secondary | ICD-10-CM

## 2019-09-13 DIAGNOSIS — I25118 Atherosclerotic heart disease of native coronary artery with other forms of angina pectoris: Secondary | ICD-10-CM

## 2019-09-13 DIAGNOSIS — E1165 Type 2 diabetes mellitus with hyperglycemia: Secondary | ICD-10-CM

## 2019-09-13 DIAGNOSIS — K219 Gastro-esophageal reflux disease without esophagitis: Secondary | ICD-10-CM | POA: Insufficient documentation

## 2019-09-13 DIAGNOSIS — I739 Peripheral vascular disease, unspecified: Secondary | ICD-10-CM | POA: Diagnosis not present

## 2019-09-13 DIAGNOSIS — E782 Mixed hyperlipidemia: Secondary | ICD-10-CM

## 2019-09-13 MED ORDER — PANTOPRAZOLE SODIUM 20 MG PO TBEC: 20.0000 mg | DELAYED_RELEASE_TABLET | Freq: Every day | ORAL | 2 refills | Status: DC

## 2019-09-13 MED ORDER — AMLODIPINE BESYLATE 10 MG PO TABS: 10.0000 mg | ORAL_TABLET | Freq: Every day | ORAL | 3 refills | Status: DC

## 2019-09-13 MED ORDER — METOPROLOL TARTRATE 50 MG PO TABS: 50.0000 mg | ORAL_TABLET | Freq: Two times a day (BID) | ORAL | 2 refills | Status: DC

## 2019-09-13 NOTE — Progress Notes (Signed)
Follow up visit  Subjective:   Chloe Garza, female    DOB: October 14, 1955, 64 y.o.   MRN: [id number]    HPI   64 y.o.Caucasianfemalewith type 2 DM, prior tobacco dependence, CAD, myocardial bridge.  Patient underwent complex PCI to RCA and LAD in April/May 2021.  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  Patient returns for follow-up today.  When she had improvement in her symptoms after the PCI, she again has recurrent exertional anginal symptoms.  Symptoms she also has retrosternal burning sensation which also occurs at rest.  Blood pressure is elevated today.  She is compliant with her medical therapy.   Current Outpatient Medications on File Prior to Visit  Medication Sig Dispense Refill  . acetaminophen (TYLENOL) 650 MG CR tablet Take 650-1,300 mg by mouth every 8 (eight) hours as needed for pain.    Marland Kitchen aspirin EC 81 MG tablet Take 81 mg by mouth daily.     Marland Kitchen Bioflavonoid Products (VITAMIN C) CHEW Chew 1 tablet by mouth daily.    . clopidogrel (PLAVIX) 75 MG tablet Take 1 tablet (75 mg total) by mouth daily. 30 tablet 2  . famotidine (PEPCID) 40 MG tablet Take 1 tablet (40 mg total) by mouth daily as needed for heartburn or indigestion. 90 tablet 1  . Insulin Glargine, 2 Unit Dial, (TOUJEO MAX SOLOSTAR) 300 UNIT/ML SOPN Inject 20 Units into the skin daily.    . metoprolol tartrate (LOPRESSOR) 50 MG tablet Take 0.5 tablets (25 mg total) by mouth 2 (two) times daily. 60 tablet 2  . Multiple Vitamins-Minerals (ZINC PO) Take 1 tablet by mouth daily.    . Potassium 99 MG TABS Take 99 mg by mouth daily.     . rosuvastatin (CRESTOR) 10 MG tablet TAKE 1 TABLET(10 MG) BY MOUTH AT BEDTIME (Patient taking differently: Take 10 mg by mouth at bedtime. ) 30 tablet 3  . Turmeric (QC TUMERIC COMPLEX PO) Take 1 capsule by mouth daily.     . Vitamin D, Cholecalciferol, 10 MCG (400 UNIT) CHEW Chew 400 Units by mouth daily.     Marland Kitchen ZINC OXIDE PO Take 1 tablet by mouth daily.    . cilostazol  (PLETAL) 50 MG tablet TAKE 1 TABLET(50 MG) BY MOUTH TWICE DAILY 60 tablet 2  . diclofenac Sodium (VOLTAREN) 1 % GEL Apply 1 application topically 4 (four) times daily as needed (pain.). (Patient not taking: Reported on 09/13/2019)    . nitroGLYCERIN (NITROSTAT) 0.4 MG SL tablet Place 1 tablet (0.4 mg total) under the tongue every 5 (five) minutes as needed for up to 25 days for chest pain. (Patient not taking: Reported on 09/13/2019) 25 tablet 3   No current facility-administered medications on file prior to visit.    Cardiovascular & other pertient studies:  EKG 08/10/2019: Sinus rhythm 56 bpm. Anterolateral T wave inversion, consider ischemia. No significant change compared to previous EKG's.  Coronary intervention  07/25/19: (Dr. Einar Gip) Successful PTCA and stenting of the proximal LAD with implantation of a 3.5 x 24 mm Synergy DES.  Stenosis reduced from 80% to 0% with TIMI-3 to TIMI-3 flow.  100 mL contrast utilized. Recommendation: Patient has significant intramyocardial bridging.  Very complex lesion, will place the patient under observation for tonight and discharged home in the morning.  CT cardiac scoring 06/21/2019: Extensive calcified coronary artery plaque putting the patient above the 90th percentile for age. CALCIUM SCORE: 871. LM: 136. LAD: 167. Cx: 95. RCA: 473 PDA: 0 Visible  lung fields: Granulomas are present in the anterior upper lobes.  Lexiscan (Walking with Jaci Carrel) Sestamibi Stress Test04/09/2019: Nondiagnostic ECG stress. Resting EKG/ECG demonstrated normal sinus rhythm, left ventricular hypertrophy with secondary ST-T changes, repolarization abnormality. Peak EKG/ECG revealed no significant ST-T change from baseline abnormality. Myocardial perfusion is normal. Stress LV EF: 76%.  No previous exam available for comparison. Low risk study.   Event monitor 02/17/2019 - 03/02/2019: Diagnostic time: 94%  Dominant rhythm: Sinus. HR 56-117 bpm. Avg HR 75 bpm. No  atrial fibrillation/atrial flutter/SVT/VT/high grade AV block, sinus pause >3sec noted. Symptoms reported: None  Echocardiogram 02/17/2019:  Left ventricle cavity is normal in size. Mild concentric hypertrophy of  the left ventricle. Normal LV systolic function with EF 55%. Normal global  wall motion. Doppler evidence of grade I (impaired) diastolic dysfunction,  normal LAP.  No significant valvular abnormality.  No evidence of pulmonary hypertension.  ABI 02/17/2019: This exam reveals normal perfusion of the right lower extremity (ABI). This exam reveals normal perfusion of the left lower extremity (ABI).   Mildly abnormal biphasic waveform at the level of bilateral ankles.   Recent labs: 07/26/2019: Glucose 128, BUN/Cr 12/0.85. EGFR 60. Na/K 139/4. Ca 8.6. Rest of the CMP normal H/H 11.5/34.5. MCV 91.7. Platelets 383.  01/20/2019: Glucose 217.  BUN/creatinine 11/0.64.  eGFR normal.  Sodium 137, potassium 3.9.  Rest of the CMP normal. H/H 14/42.  MCV 87.  Platelets 240. Cholesterol 218, triglycerides 293, HDL 44, LDL 114 Hemoglobin A1c 12.1%. ANA screen positive.   Review of Systems  Cardiovascular: Positive for chest pain and dyspnea on exertion. Negative for leg swelling, palpitations and syncope.  Gastrointestinal: Negative for melena.         Vitals:   09/13/19 1022 09/13/19 1027  BP: (!) 174/81 (!) 173/81  Pulse: (!) 51 (!) 50  Resp: 17   SpO2: 98%      Body mass index is 25.4 kg/m. Filed Weights   09/13/19 1022  Weight: 148 lb (67.1 kg)     Objective:   Physical Exam Vitals and nursing note reviewed.  Constitutional:      General: She is not in acute distress. Neck:     Vascular: No JVD.  Cardiovascular:     Rate and Rhythm: Normal rate and regular rhythm.     Pulses:          Carotid pulses are 2+ on the right side and 2+ on the left side.      Femoral pulses are 2+ on the right side and 2+ on the left side.      Dorsalis pedis pulses are 1+ on  the right side and 1+ on the left side.       Posterior tibial pulses are 0 on the right side and 0 on the left side.     Heart sounds: Normal heart sounds. No murmur heard.  No gallop.   Pulmonary:     Effort: Pulmonary effort is normal.     Breath sounds: Normal breath sounds. No wheezing or rales.  Abdominal:     General: Bowel sounds are normal.     Palpations: Abdomen is soft.         Assessment & Recommendations:   64 y.o.Caucasianfemalewith type 2 DM, prior tobacco dependence, CAD, myocardial bridge.  CAD: Successful PCI to RCA and LAD.  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  I suspect that this is causing some degree of resting ischemia and chest pain.  Treatment remains primarily medical management.   Continue metoprolol tartrate 50 mg twice daily.  Added amlodipine 10 mg daily. If symptoms do not improve by next visit, will consider repeat exercise nuclear stress testing to assess degree of ischemia.  In addition, for possible GERD, I changed her Pepcid to pantoprazole  Given patient's lifestyle limiting anginal symptoms, disability would be reasonable to consider if there is no improvement in her symptoms.  Claudication: Mild. Stable. Medical management  Type 2 diabetes mellitus: Continue follow-up with PCP.  Follow-up in 4 weeks  Briyanna Billingham Esther Hardy, MD [facility] Pager: [phone] Office: [phone]

## 2019-09-25 ENCOUNTER — Ambulatory Visit: Payer: BC Managed Care – PPO | Admitting: Cardiology

## 2019-10-01 ENCOUNTER — Other Ambulatory Visit: Payer: Self-pay | Admitting: Cardiology

## 2019-10-05 ENCOUNTER — Ambulatory Visit: Payer: BC Managed Care – PPO | Admitting: Cardiology

## 2019-10-05 ENCOUNTER — Ambulatory Visit: Payer: BC Managed Care – PPO

## 2019-10-05 ENCOUNTER — Other Ambulatory Visit: Payer: Self-pay

## 2019-10-05 ENCOUNTER — Telehealth: Payer: Self-pay

## 2019-10-05 DIAGNOSIS — I499 Cardiac arrhythmia, unspecified: Secondary | ICD-10-CM

## 2019-10-05 DIAGNOSIS — R42 Dizziness and giddiness: Secondary | ICD-10-CM | POA: Insufficient documentation

## 2019-10-05 NOTE — Telephone Encounter (Signed)
Pt called to inform us that she has been dizzy for the past few weeks and is scared of when driving. Please advised

## 2019-10-05 NOTE — Progress Notes (Signed)
Recent lightheadedness. 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   Orthostatic VS for the past 72 hrs (Last 3 readings):  Orthostatic BP Patient Position BP Location Cuff Size Orthostatic Pulse  10/05/19 1615 128/70 Standing Left Arm Normal 60  10/05/19 1614 136/84 Sitting Left Arm Normal 56  10/05/19 1613 142/68 Supine Left Arm Normal 54    Physical Exam Vitals and nursing note reviewed.  Constitutional:      General: She is not in acute distress.   Placed 2 week Zio patch to rule out tachy/brady arrhthymias. Suspect vertigo may be the etiology.  Elder Negus, MD [facility] Pager: [phone] Office: [phone]

## 2019-10-05 NOTE — Telephone Encounter (Signed)
Reduce amlodipine to 5 mg daily. Hydrate well. Can place 2 week event monitor to ensure no arrhythmia. If patient agreeable, please place order for non-live monitor  Thanks MJP

## 2019-10-05 NOTE — Telephone Encounter (Signed)
I can see her this afternoon in office.

## 2019-10-05 NOTE — Telephone Encounter (Signed)
Pt mention that she has stopped her bp medication for the past 4 days and the dizziness is still there.

## 2019-10-09 DIAGNOSIS — E119 Type 2 diabetes mellitus without complications: Secondary | ICD-10-CM | POA: Diagnosis not present

## 2019-10-09 DIAGNOSIS — I119 Hypertensive heart disease without heart failure: Secondary | ICD-10-CM | POA: Diagnosis not present

## 2019-10-09 DIAGNOSIS — R42 Dizziness and giddiness: Secondary | ICD-10-CM | POA: Diagnosis not present

## 2019-10-18 ENCOUNTER — Ambulatory Visit: Payer: BC Managed Care – PPO | Admitting: Cardiology

## 2019-10-23 ENCOUNTER — Other Ambulatory Visit: Payer: Self-pay | Admitting: Cardiology

## 2019-10-23 DIAGNOSIS — E783 Hyperchylomicronemia: Secondary | ICD-10-CM

## 2019-10-25 ENCOUNTER — Other Ambulatory Visit: Payer: Self-pay

## 2019-10-25 ENCOUNTER — Ambulatory Visit: Payer: BC Managed Care – PPO | Admitting: Cardiology

## 2019-10-25 ENCOUNTER — Other Ambulatory Visit: Payer: Self-pay | Admitting: Cardiology

## 2019-10-25 ENCOUNTER — Encounter: Payer: Self-pay | Admitting: Cardiology

## 2019-10-25 VITALS — BP 140/69 | HR 55 | Resp 17 | Ht 64.0 in | Wt 153.0 lb

## 2019-10-25 DIAGNOSIS — I25118 Atherosclerotic heart disease of native coronary artery with other forms of angina pectoris: Secondary | ICD-10-CM | POA: Diagnosis not present

## 2019-10-25 DIAGNOSIS — R0789 Other chest pain: Secondary | ICD-10-CM

## 2019-10-25 DIAGNOSIS — Q245 Malformation of coronary vessels: Secondary | ICD-10-CM | POA: Diagnosis not present

## 2019-10-25 DIAGNOSIS — R002 Palpitations: Secondary | ICD-10-CM

## 2019-10-25 DIAGNOSIS — I739 Peripheral vascular disease, unspecified: Secondary | ICD-10-CM

## 2019-10-25 DIAGNOSIS — E1165 Type 2 diabetes mellitus with hyperglycemia: Secondary | ICD-10-CM | POA: Diagnosis not present

## 2019-10-25 NOTE — Progress Notes (Signed)
Follow up visit  Subjective:   Chloe Garza, female    DOB: 27-Dec-1955, 64 y.o.   MRN: [id number]    HPI   64 y.o.Caucasianfemalewith type 2 DM, prior tobacco dependence, CAD, myocardial bridge.  Patient underwent complex PCI to RCA and LAD in April/May 2021.  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  Patient returns for follow-up today.  When she had improvement in her symptoms after the PCI, she again has recurrent chest pain.   Recently, she has started having dizziness symptoms with postural change, Orthostatics have been negative. She wore 2 week monitor to rule put arrhthymias as etiology, report pending.     Current Outpatient Medications on File Prior to Visit  Medication Sig Dispense Refill  . acetaminophen (TYLENOL) 650 MG CR tablet Take 650-1,300 mg by mouth every 8 (eight) hours as needed for pain.    Marland Kitchen amLODipine (NORVASC) 10 MG tablet Take 1 tablet (10 mg total) by mouth daily. 30 tablet 3  . aspirin EC 81 MG tablet Take 81 mg by mouth daily.     Marland Kitchen Bioflavonoid Products (VITAMIN C) CHEW Chew 1 tablet by mouth daily.    . clopidogrel (PLAVIX) 75 MG tablet TAKE 1 TABLET BY MOUTH EVERY DAY 30 tablet 2  . Insulin Glargine, 2 Unit Dial, (TOUJEO MAX SOLOSTAR) 300 UNIT/ML SOPN Inject 20 Units into the skin daily.    . metoprolol tartrate (LOPRESSOR) 50 MG tablet Take 1 tablet (50 mg total) by mouth 2 (two) times daily. 180 tablet 2  . Multiple Vitamins-Minerals (ZINC PO) Take 1 tablet by mouth daily.    . nitroGLYCERIN (NITROSTAT) 0.4 MG SL tablet Place 1 tablet (0.4 mg total) under the tongue every 5 (five) minutes as needed for up to 25 days for chest pain. (Patient not taking: Reported on 09/13/2019) 25 tablet 3  . pantoprazole (PROTONIX) 20 MG tablet Take 1 tablet (20 mg total) by mouth daily. 30 tablet 2  . Potassium 99 MG TABS Take 99 mg by mouth daily.     . rosuvastatin (CRESTOR) 10 MG tablet TAKE 1 TABLET(10 MG) BY MOUTH AT BEDTIME 30 tablet 3  .  Turmeric (QC TUMERIC COMPLEX PO) Take 1 capsule by mouth daily.     . Vitamin D, Cholecalciferol, 10 MCG (400 UNIT) CHEW Chew 400 Units by mouth daily.     Marland Kitchen ZINC OXIDE PO Take 1 tablet by mouth daily.     No current facility-administered medications on file prior to visit.    Cardiovascular & other pertient studies:  EKG 08/10/2019: Sinus rhythm 56 bpm. Anterolateral T wave inversion, consider ischemia. No significant change compared to previous EKG's.  Coronary intervention  07/25/19: (Dr. Einar Gip) Successful PTCA and stenting of the proximal LAD with implantation of a 3.5 x 24 mm Synergy DES.  Stenosis reduced from 80% to 0% with TIMI-3 to TIMI-3 flow.  100 mL contrast utilized. Recommendation: Patient has significant intramyocardial bridging.  Very complex lesion, will place the patient under observation for tonight and discharged home in the morning.  CT cardiac scoring 06/21/2019: Extensive calcified coronary artery plaque putting the patient above the 90th percentile for age. CALCIUM SCORE: 871. LM: 136. LAD: 167. Cx: 95. RCA: 127 PDA: 0 Visible lung fields: Granulomas are present in the anterior upper lobes.  Lexiscan (Walking with Jaci Carrel) Sestamibi Stress Test04/09/2019: Nondiagnostic ECG stress. Resting EKG/ECG demonstrated normal sinus rhythm, left ventricular hypertrophy with secondary ST-T changes, repolarization abnormality. Peak EKG/ECG revealed no significant  ST-T change from baseline abnormality. Myocardial perfusion is normal. Stress LV EF: 76%.  No previous exam available for comparison. Low risk study.   Event monitor 02/17/2019 - 03/02/2019: Diagnostic time: 94%  Dominant rhythm: Sinus. HR 56-117 bpm. Avg HR 75 bpm. No atrial fibrillation/atrial flutter/SVT/VT/high grade AV block, sinus pause >3sec noted. Symptoms reported: None  Echocardiogram 02/17/2019:  Left ventricle cavity is normal in size. Mild concentric hypertrophy of  the left ventricle. Normal  LV systolic function with EF 55%. Normal global  wall motion. Doppler evidence of grade I (impaired) diastolic dysfunction,  normal LAP.  No significant valvular abnormality.  No evidence of pulmonary hypertension.  ABI 02/17/2019: This exam reveals normal perfusion of the right lower extremity (ABI). This exam reveals normal perfusion of the left lower extremity (ABI).   Mildly abnormal biphasic waveform at the level of bilateral ankles.   Recent labs: 07/26/2019: Glucose 128, BUN/Cr 12/0.85. EGFR 60. Na/K 139/4. Ca 8.6. Rest of the CMP normal H/H 11.5/34.5. MCV 91.7. Platelets 383.  01/20/2019: Glucose 217.  BUN/creatinine 11/0.64.  eGFR normal.  Sodium 137, potassium 3.9.  Rest of the CMP normal. H/H 14/42.  MCV 87.  Platelets 240. Cholesterol 218, triglycerides 293, HDL 44, LDL 114 Hemoglobin A1c 12.1%. ANA screen positive.   Review of Systems  Cardiovascular: Positive for chest pain and dyspnea on exertion. Negative for leg swelling, palpitations and syncope.  Gastrointestinal: Negative for melena.         Vitals:   10/25/19 1039  BP: 140/69  Pulse: (!) 55  Resp: 17  SpO2: 97%     Body mass index is 26.26 kg/m. Filed Weights   10/25/19 1039  Weight: 153 lb (69.4 kg)     Objective:   Physical Exam Vitals and nursing note reviewed.  Constitutional:      General: She is not in acute distress. Neck:     Vascular: No JVD.  Cardiovascular:     Rate and Rhythm: Normal rate and regular rhythm.     Pulses:          Carotid pulses are 2+ on the right side and 2+ on the left side.      Femoral pulses are 2+ on the right side and 2+ on the left side.      Dorsalis pedis pulses are 1+ on the right side and 1+ on the left side.       Posterior tibial pulses are 0 on the right side and 0 on the left side.     Heart sounds: Normal heart sounds. No murmur heard.  No gallop.   Pulmonary:     Effort: Pulmonary effort is normal.     Breath sounds: Normal breath  sounds. No wheezing or rales.  Abdominal:     General: Bowel sounds are normal.     Palpations: Abdomen is soft.         Assessment & Recommendations:   64 y.o.Caucasianfemalewith type 2 DM, prior tobacco dependence, CAD, myocardial bridge.  CAD: Successful PCI to RCA and LAD.  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  I suspect that this is causing some degree of resting ischemia and chest pain.  Treatment remains primarily medical management.   Continue metoprolol tartrate 50 mg twice daily, amlodipine 10 mg daily. Patient does not want to consider repeat stress testing, cardiac catheterization, or surgery for her myocardial bridge related symptoms. She states that she has had these symptoms "all her life" and feels like  they have gotten worse only after medications and stenting procedures. She says that she is turning 26 in two weeks, she would like to stop working and spend more time with her grandchildren and great grandchildren. I stated that I respect her wishes and will not recommend any more cardiac tests or procedures at this time.   Claudication: Mild. Stable. Medical management  Type 2 diabetes mellitus: Continue follow-up with PCP.  F/u in 6 months  Sobia Karger Esther Hardy, MD [facility] Pager: [phone] Office: [phone]

## 2019-10-26 ENCOUNTER — Other Ambulatory Visit: Payer: Self-pay | Admitting: Cardiology

## 2019-10-26 DIAGNOSIS — I25118 Atherosclerotic heart disease of native coronary artery with other forms of angina pectoris: Secondary | ICD-10-CM

## 2019-10-30 DIAGNOSIS — I499 Cardiac arrhythmia, unspecified: Secondary | ICD-10-CM | POA: Diagnosis not present

## 2019-10-31 DIAGNOSIS — I499 Cardiac arrhythmia, unspecified: Secondary | ICD-10-CM | POA: Diagnosis not present

## 2019-11-22 DIAGNOSIS — E119 Type 2 diabetes mellitus without complications: Secondary | ICD-10-CM | POA: Diagnosis not present

## 2019-11-22 DIAGNOSIS — I2510 Atherosclerotic heart disease of native coronary artery without angina pectoris: Secondary | ICD-10-CM | POA: Diagnosis not present

## 2019-11-22 DIAGNOSIS — E782 Mixed hyperlipidemia: Secondary | ICD-10-CM | POA: Diagnosis not present

## 2019-11-25 ENCOUNTER — Other Ambulatory Visit: Payer: Self-pay | Admitting: Cardiology

## 2019-11-25 DIAGNOSIS — I25118 Atherosclerotic heart disease of native coronary artery with other forms of angina pectoris: Secondary | ICD-10-CM

## 2019-11-28 ENCOUNTER — Other Ambulatory Visit: Payer: Self-pay | Admitting: Cardiology

## 2019-12-09 ENCOUNTER — Other Ambulatory Visit: Payer: Self-pay | Admitting: Cardiology

## 2019-12-09 DIAGNOSIS — K219 Gastro-esophageal reflux disease without esophagitis: Secondary | ICD-10-CM

## 2020-01-01 ENCOUNTER — Other Ambulatory Visit: Payer: Self-pay | Admitting: Cardiology

## 2020-01-02 DIAGNOSIS — E119 Type 2 diabetes mellitus without complications: Secondary | ICD-10-CM | POA: Diagnosis not present

## 2020-01-02 DIAGNOSIS — Z9181 History of falling: Secondary | ICD-10-CM | POA: Diagnosis not present

## 2020-01-07 ENCOUNTER — Other Ambulatory Visit: Payer: Self-pay | Admitting: Cardiology

## 2020-01-07 DIAGNOSIS — Q245 Malformation of coronary vessels: Secondary | ICD-10-CM

## 2020-01-07 DIAGNOSIS — I25118 Atherosclerotic heart disease of native coronary artery with other forms of angina pectoris: Secondary | ICD-10-CM

## 2020-02-15 ENCOUNTER — Other Ambulatory Visit: Payer: Self-pay | Admitting: Cardiology

## 2020-02-15 DIAGNOSIS — K219 Gastro-esophageal reflux disease without esophagitis: Secondary | ICD-10-CM

## 2020-02-28 ENCOUNTER — Other Ambulatory Visit: Payer: Self-pay | Admitting: Cardiology

## 2020-02-28 DIAGNOSIS — E783 Hyperchylomicronemia: Secondary | ICD-10-CM

## 2020-03-11 DIAGNOSIS — E119 Type 2 diabetes mellitus without complications: Secondary | ICD-10-CM | POA: Diagnosis not present

## 2020-03-11 DIAGNOSIS — Z6827 Body mass index (BMI) 27.0-27.9, adult: Secondary | ICD-10-CM | POA: Diagnosis not present

## 2020-03-11 DIAGNOSIS — I2510 Atherosclerotic heart disease of native coronary artery without angina pectoris: Secondary | ICD-10-CM | POA: Diagnosis not present

## 2020-03-11 DIAGNOSIS — Z0000 Encounter for general adult medical examination without abnormal findings: Secondary | ICD-10-CM | POA: Diagnosis not present

## 2020-03-11 DIAGNOSIS — E782 Mixed hyperlipidemia: Secondary | ICD-10-CM | POA: Diagnosis not present

## 2020-03-11 DIAGNOSIS — R1032 Left lower quadrant pain: Secondary | ICD-10-CM | POA: Diagnosis not present

## 2020-03-30 ENCOUNTER — Other Ambulatory Visit: Payer: Self-pay | Admitting: Cardiology

## 2020-04-26 ENCOUNTER — Encounter: Payer: Self-pay | Admitting: Cardiology

## 2020-04-26 ENCOUNTER — Ambulatory Visit: Payer: BC Managed Care – PPO | Admitting: Cardiology

## 2020-04-26 ENCOUNTER — Other Ambulatory Visit: Payer: Self-pay

## 2020-04-26 VITALS — BP 117/73 | HR 60 | Temp 98.2°F | Resp 17 | Ht 64.0 in | Wt 159.6 lb

## 2020-04-26 DIAGNOSIS — E1165 Type 2 diabetes mellitus with hyperglycemia: Secondary | ICD-10-CM

## 2020-04-26 DIAGNOSIS — I739 Peripheral vascular disease, unspecified: Secondary | ICD-10-CM | POA: Diagnosis not present

## 2020-04-26 DIAGNOSIS — I25118 Atherosclerotic heart disease of native coronary artery with other forms of angina pectoris: Secondary | ICD-10-CM | POA: Diagnosis not present

## 2020-04-26 DIAGNOSIS — Q245 Malformation of coronary vessels: Secondary | ICD-10-CM | POA: Diagnosis not present

## 2020-04-26 NOTE — Progress Notes (Signed)
Follow up visit  Subjective:   Chloe Garza, female    DOB: 01-May-1955, 65 y.o.   MRN: [id number]    HPI   65 y.o.Caucasianfemalewith type 2 DM, prior tobacco dependence, CAD, myocardial bridge.  Patient has had stable chest pain, worse with stress, especially at work. Pain is no worse compared to previously. She reports a lot of stress at work. She works a [employer], where she is the only employee.    Current Outpatient Medications on File Prior to Visit  Medication Sig Dispense Refill  . acetaminophen (TYLENOL) 650 MG CR tablet Take 650-1,300 mg by mouth every 8 (eight) hours as needed for pain.    Marland Kitchen amLODipine (NORVASC) 10 MG tablet TAKE 1 TABLET(10 MG) BY MOUTH DAILY 30 tablet 3  . aspirin EC 81 MG tablet Take 81 mg by mouth daily.     Marland Kitchen Bioflavonoid Products (VITAMIN C) CHEW Chew 1 tablet by mouth daily.    . cilostazol (PLETAL) 50 MG tablet TAKE 1 TABLET(50 MG) BY MOUTH TWICE DAILY 60 tablet 3  . clopidogrel (PLAVIX) 75 MG tablet TAKE 1 TABLET BY MOUTH EVERY DAY 30 tablet 2  . Insulin Glargine, 2 Unit Dial, (TOUJEO MAX SOLOSTAR) 300 UNIT/ML SOPN Inject 20 Units into the skin daily.    . meclizine (ANTIVERT) 12.5 MG tablet Take 12.5 mg by mouth 3 (three) times daily as needed.    . metoprolol tartrate (LOPRESSOR) 25 MG tablet TAKE 1 TABLET(25 MG) BY MOUTH TWICE DAILY 60 tablet 6  . metoprolol tartrate (LOPRESSOR) 50 MG tablet Take 1 tablet (50 mg total) by mouth 2 (two) times daily. 180 tablet 2  . Multiple Vitamins-Minerals (ZINC PO) Take 1 tablet by mouth daily.    . nitroGLYCERIN (NITROSTAT) 0.4 MG SL tablet DISSOLVE 1 TABLET UNDER THE TONGUE EVERY 5 MINUTES AS NEEDED FOR UP TO 25 DAYS FOR CHEST PAIN, IF NO RELIEF IN 15 MINUTES, CALL 911 25 tablet 3  . pantoprazole (PROTONIX) 20 MG tablet TAKE 1 TABLET(20 MG) BY MOUTH DAILY 30 tablet 2  . Potassium 99 MG TABS Take 99 mg by mouth daily.     . rosuvastatin (CRESTOR) 10 MG tablet TAKE 1 TABLET(10 MG) BY MOUTH  AT BEDTIME 30 tablet 3  . Turmeric (QC TUMERIC COMPLEX PO) Take 1 capsule by mouth daily.     . Vitamin D, Cholecalciferol, 10 MCG (400 UNIT) CHEW Chew 400 Units by mouth daily.     Marland Kitchen ZINC OXIDE PO Take 1 tablet by mouth daily.     No current facility-administered medications on file prior to visit.    Cardiovascular & other pertient studies:  EKG 04/26/2020: Sinus rhythm 52 bpm Anterolateral and inferior ischemia Lead loss V6 No significant change compared to previous EKG's.  Coronary intervention  07/25/19: (Dr. Einar Gip) Successful PTCA and stenting of the proximal LAD with implantation of a 3.5 x 24 mm Synergy DES.  Stenosis reduced from 80% to 0% with TIMI-3 to TIMI-3 flow.  100 mL contrast utilized. Recommendation: Patient has significant intramyocardial bridging.  Very complex lesion, will place the patient under observation for tonight and discharged home in the morning.  Coronary intervention 07/06/2019: LM: Normal LAD: Tortuous vessel. Mid 80% stenosis, followed by a prominent myocardial bridge LCx: Normal Ramus: Normal RCA: Prox severe 80% stenosis        Mid 40% calcific disease  Intravascular ultrasound (IVUS) Successful percutaneous coronary intervention prox RCA     PTCA and stent placement Synergy  DES 3.5 X 20 mm      Post dilatation with 4.0X15 mm Parlier balloon Unsuccessful Mynx closure Rt CFA requiring Femostop placement  Emergency re-look coronary angiography showed no acute vessel closure, patent RCA stent. Successful Perclosre closure Lt CFA  LVEDP 15 mmHg LVEF >65%  Long term monitor 12 days 10/05/2019 - 10/17/2019: Dominant rhythm: Sinus. HR 48-158 bpm. Avg HR 59 bpm. 2 episodes of SVT, fastest at 113 bpm for 4 beats, longest for 7 beats at 91 bpm. 1% SVE burden. 1 episode of 4 beat NSVT <1% VE burden. No atrial fibrillation/atrial flutter/SVT/VT/high grade AV block, sinus pause >3sec noted. 0 patient triggered events.   Echocardiogram 02/17/2019:   Left ventricle cavity is normal in size. Mild concentric hypertrophy of  the left ventricle. Normal LV systolic function with EF 55%. Normal global  wall motion. Doppler evidence of grade I (impaired) diastolic dysfunction,  normal LAP.  No significant valvular abnormality.  No evidence of pulmonary hypertension.  ABI 02/17/2019: This exam reveals normal perfusion of the right lower extremity (ABI). This exam reveals normal perfusion of the left lower extremity (ABI).   Mildly abnormal biphasic waveform at the level of bilateral ankles.   Recent labs: 02/21/2020: Glucose 165, BUN/Cr 9/0.59. EGFR normal. Na/K 136/4.3. Rest of the CMP normal H/H 15/45. MCV 88. Platelets 222 HbA1C N/A Chol 141, TG 336, HDL 33, LDL 68 TSH 2.1 normal  07/26/2019: Glucose 128, BUN/Cr 12/0.85. EGFR 60. Na/K 139/4. Ca 8.6. Rest of the CMP normal H/H 11.5/34.5. MCV 91.7. Platelets 383.  01/20/2019: Glucose 217.  BUN/creatinine 11/0.64.  eGFR normal.  Sodium 137, potassium 3.9.  Rest of the CMP normal. H/H 14/42.  MCV 87.  Platelets 240. Cholesterol 218, triglycerides 293, HDL 44, LDL 114 Hemoglobin A1c 12.1%. ANA screen positive.   Review of Systems  Cardiovascular: Positive for chest pain and dyspnea on exertion. Negative for leg swelling, palpitations and syncope.  Gastrointestinal: Negative for melena.        Vitals:   04/26/20 1019  BP: 117/73  Pulse: 60  Resp: 17  Temp: 98.2 F (36.8 C)  SpO2: 98%     Body mass index is 27.4 kg/m. Filed Weights   04/26/20 1019  Weight: 159 lb 9.6 oz (72.4 kg)     Objective:   Physical Exam Vitals and nursing note reviewed.  Constitutional:      General: She is not in acute distress. Neck:     Vascular: No JVD.  Cardiovascular:     Rate and Rhythm: Normal rate and regular rhythm.     Pulses:          Carotid pulses are 2+ on the right side and 2+ on the left side.      Femoral pulses are 2+ on the right side and 2+ on the left side.       Dorsalis pedis pulses are 1+ on the right side and 1+ on the left side.       Posterior tibial pulses are 0 on the right side and 0 on the left side.     Heart sounds: Normal heart sounds. No murmur heard.  No gallop.   Pulmonary:     Effort: Pulmonary effort is normal.     Breath sounds: Normal breath sounds. No wheezing or rales.  Abdominal:     General: Bowel sounds are normal.     Palpations: Abdomen is soft.         Assessment & Recommendations:  65 y.o.Caucasianfemalewith type 2 DM, prior tobacco dependence, CAD, myocardial bridge.  CAD: Successful PCI to RCA and LAD.  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  I suspect that this is causing some degree of resting ischemia and chest pain.  Treatment remains primarily medical management.   Continue metoprolol tartrate 50 mg twice daily, amlodipine 10 mg daily. Patient does not want to consider repeat stress testing, cardiac catheterization, or any high risk surgery for her myocardial bridge related symptoms.   She would like to stop working and spend more time with her grandchildren and great grandchildren. I stated that I respect her wishes and will not recommend any more cardiac tests or procedures at this time.   Claudication: Mild. Stable. Medical management  Type 2 diabetes mellitus: Continue follow-up with PCP.  F/u in 6 months  Manish Esther Hardy, MD [facility] Pager: [phone] Office: [phone]

## 2020-04-30 ENCOUNTER — Other Ambulatory Visit: Payer: Self-pay | Admitting: Cardiology

## 2020-04-30 DIAGNOSIS — E783 Hyperchylomicronemia: Secondary | ICD-10-CM

## 2020-05-09 ENCOUNTER — Ambulatory Visit (INDEPENDENT_AMBULATORY_CARE_PROVIDER_SITE_OTHER): Payer: BC Managed Care – PPO | Admitting: Specialist

## 2020-05-09 ENCOUNTER — Ambulatory Visit: Payer: Self-pay

## 2020-05-09 ENCOUNTER — Encounter: Payer: Self-pay | Admitting: Specialist

## 2020-05-09 ENCOUNTER — Other Ambulatory Visit: Payer: Self-pay

## 2020-05-09 VITALS — BP 115/74 | HR 52 | Ht 64.0 in | Wt 159.6 lb

## 2020-05-09 DIAGNOSIS — M542 Cervicalgia: Secondary | ICD-10-CM | POA: Diagnosis not present

## 2020-05-09 DIAGNOSIS — M47819 Spondylosis without myelopathy or radiculopathy, site unspecified: Secondary | ICD-10-CM | POA: Diagnosis not present

## 2020-05-09 DIAGNOSIS — R202 Paresthesia of skin: Secondary | ICD-10-CM

## 2020-05-09 DIAGNOSIS — R200 Anesthesia of skin: Secondary | ICD-10-CM | POA: Diagnosis not present

## 2020-05-09 DIAGNOSIS — M4722 Other spondylosis with radiculopathy, cervical region: Secondary | ICD-10-CM | POA: Diagnosis not present

## 2020-05-09 MED ORDER — GABAPENTIN 100 MG PO CAPS: 100.0000 mg | ORAL_CAPSULE | Freq: Every day | ORAL | 3 refills | Status: DC

## 2020-05-09 MED ORDER — DICLOFENAC POTASSIUM 50 MG PO TABS: 50.0000 mg | ORAL_TABLET | Freq: Two times a day (BID) | ORAL | 1 refills | Status: DC

## 2020-05-09 NOTE — Progress Notes (Signed)
Office Visit Note   Patient: Chloe Garza           Date of Birth: February 14, 1956           MRN: [id number] Visit Date: 05/09/2020              Requested by: Ralene Ok, MD [address] [place],  [place] [address] PCP: Ralene Ok, MD   Assessment & Plan: Visit Diagnoses:  1. Cervicalgia   2. Spondylosis without myelopathy or radiculopathy   3. Other spondylosis with radiculopathy, cervical region   4. Bilateral arm numbness and tingling while sleeping     Plan: Avoid overhead lifting and overhead use of the arms. Do not lift greater than 5 lbs. Adjust head rest in vehicle to prevent hyperextension if rear ended. Take extra precautions to avoid falling. Biofreeze locally can be of benefit . Cataflam 50mg  Twice a day with a meal or snack Hemp CBD capsules, amazon.com 5,000-7,000 mg per bottle, 60 capsules per bottle, take one capsule twice a day. Cane in the left hand to use with left leg weight bearing. Follow-Up Instructions: No follow-ups on file Stop meloxicam while taking diclofenac potassium.  Ordered EMG/NCV of the arms to assess for cervical pinched nerve vs carpal tunnel syndrome or cubital tunnel syndrome. Vitamin B complex B6, B12 and thiamine. Follow-Up Instructions: Return in about 3 weeks (around 05/30/2020).   Orders:  Orders Placed This Encounter  Procedures  . XR Cervical Spine 2 or 3 views   No orders of the defined types were placed in this encounter.     Procedures: No procedures performed   Clinical Data: No additional findings.   Subjective: Chief Complaint  Patient presents with  . Right Hand - Numbness  . Left Hand - Numbness  . Middle Back - Numbness    65 year old female with history of cardiac arterial anamolly, she has had a coronary artery stent placed last 07/2020. She is seen today with complaints of bilateral arm numbness and tingling with numbness and tingling into the left ulnar  Digits and also with numbness and  tingling into the right long and index finger. She was last seen over 11 year ago. Decreased grip strength and dropping items with the hands for years. She works doing 08/2020.   Review of Systems  Constitutional: Negative.  Negative for activity change, appetite change, chills, diaphoresis, fatigue, fever and unexpected weight change.  HENT: Negative.  Negative for congestion, dental problem, drooling, ear discharge, ear pain, facial swelling, hearing loss, mouth sores, nosebleeds, postnasal drip, rhinorrhea, sinus pressure, sinus pain, sneezing, sore throat, tinnitus, trouble swallowing and voice change.   Eyes: Negative.   Respiratory: Negative.   Cardiovascular: Negative.   Gastrointestinal: Negative.   Endocrine: Negative.   Genitourinary: Negative.   Musculoskeletal: Positive for neck pain and neck stiffness.  Skin: Negative.   Neurological: Positive for weakness and numbness.  Psychiatric/Behavioral: Negative.      Objective: Vital Signs: BP 115/74 (BP Location: Left Arm, Patient Position: Sitting)   Pulse (!) 52   Ht 5\' 4"  (1.626 m)   Wt 159 lb 9.6 oz (72.4 kg)   BMI 27.40 kg/m   Physical Exam Constitutional:      Appearance: She is well-developed and well-nourished.  HENT:     Head: Normocephalic and atraumatic.  Eyes:     Extraocular Movements: EOM normal.     Pupils: Pupils are equal, round, and reactive to light.  Pulmonary:  Effort: Pulmonary effort is normal.     Breath sounds: Normal breath sounds.  Abdominal:     General: Bowel sounds are normal.     Palpations: Abdomen is soft.  Musculoskeletal:     Cervical back: Normal range of motion and neck supple.     Lumbar back: Negative right straight leg raise test and negative left straight leg raise test.  Skin:    General: Skin is warm and dry.  Neurological:     Mental Status: She is alert and oriented to person, place, and time.  Psychiatric:        Mood and Affect: Mood and affect normal.         Behavior: Behavior normal.        Thought Content: Thought content normal.        Judgment: Judgment normal.     Left Ankle Exam   Comments:  Can not walk one mile.   Back Exam   Tenderness  The patient is experiencing tenderness in the cervical.  Range of Motion  Extension: abnormal  Flexion: abnormal  Lateral bend right:  70 normal  Lateral bend left:  50 normal  Rotation right: 70  Rotation left: 50   Muscle Strength  Right Quadriceps:  5/5  Left Quadriceps:  5/5  Right Hamstrings:  5/5  Left Hamstrings:  5/5   Tests  Straight leg raise right: negative Straight leg raise left: negative      Specialty Comments:  No specialty comments available.  Imaging: XR Cervical Spine 2 or 3 views  Result Date: 05/09/2020 AP and lateral flexion and extension radiographs of the cervical spine with DDD C5-6 and C6-7 with disc narrowing and anterior disc osteophytes and posterior disc osteophytes. Bilateral right greater than left uncovertebral osteophytes.     PMFS History: Patient Active Problem List   Diagnosis Date Noted  . Lightheadedness 10/05/2019  . Gastroesophageal reflux disease 09/13/2019  . 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 Post PTCA 07/06/2019  . Uncontrolled type 2 diabetes mellitus with hyperglycemia (HCC) 06/27/2019  . PAD (peripheral artery disease) (HCC) 06/27/2019  . Coronary artery disease of native artery of native heart with stable angina pectoris (HCC) 06/27/2019  . Tobacco abuse 02/08/2019  . Abnormal peripheral pulse 02/08/2019  . Mixed hyperglyceridemia 02/08/2019  . Stable angina (HCC) 02/08/2019  . Palpitations 02/08/2019  . Precordial pain 02/08/2019   Past Medical History:  Diagnosis Date  . Diabetes mellitus without complication (HCC)   . Hyperlipidemia   . Hypertension     Family History  Problem Relation Age of Onset  . Hypertension Mother   . Stroke Mother   . Alzheimer's disease Mother   . Heart attack Father    . Pneumonia Father   . CAD Sister   . Hypertension Sister   . Hypertension Brother   . Heart failure Brother   . Hypertension Sister   . Hypertension Sister   . Hypertension Sister   . Hypertension Brother   . Heart failure Brother   . Hypertension Brother     Past Surgical History:  Procedure Laterality Date  . CATARACT EXTRACTION, BILATERAL    . CORONARY STENT INTERVENTION N/A 07/06/2019   Procedure: CORONARY STENT INTERVENTION;  Surgeon: Elder Negus, MD;  Location: [facility];  Service: Cardiovascular;  Laterality: N/A;  RCA prox  . CORONARY STENT INTERVENTION N/A 07/25/2019   Procedure: CORONARY STENT INTERVENTION;  Surgeon: Yates Decamp, MD;  Location: [facility];  Service:  Cardiovascular;  Laterality: N/A;  . INTRAVASCULAR ULTRASOUND/IVUS N/A 07/06/2019   Procedure: Intravascular Ultrasound/IVUS;  Surgeon: Elder Negus, MD;  Location: [facility];  Service: Cardiovascular;  Laterality: N/A;  . LEFT HEART CATH AND CORONARY ANGIOGRAPHY N/A 07/06/2019   Procedure: LEFT HEART CATH AND CORONARY ANGIOGRAPHY;  Surgeon: Elder Negus, MD;  Location: [facility];  Service: Cardiovascular;  Laterality: N/A;  . TONSILLECTOMY    . TUBAL LIGATION     Social History   Occupational History  . Not on file  Tobacco Use  . Smoking status: Former Smoker    Packs/day: 0.25    Years: 45.00    Pack years: 11.25    Types: Cigarettes    Quit date: 2021    Years since quitting: 1.1  . Smokeless tobacco: Never Used  . Tobacco comment: Patient is dow to once cigarette a day  Vaping Use  . Vaping Use: Never used  Substance and Sexual Activity  . Alcohol use: Not Currently  . Drug use: Never  . Sexual activity: Not on file

## 2020-05-09 NOTE — Patient Instructions (Addendum)
Avoid overhead lifting and overhead use of the arms. Do not lift greater than 5 lbs. Adjust head rest in vehicle to prevent hyperextension if rear ended. Take extra precautions to avoid falling. Biofreeze locally can be of benefit . Cataflam 50mg  Twice a day with a meal or snack Hemp CBD capsules, amazon.com 5,000-7,000 mg per bottle, 60 capsules per bottle, take one capsule twice a day. Cane in the left hand to use with left leg weight bearing. Follow-Up Instructions: No follow-ups on file.  Ordered EMG/NCV of the arms to assess for cervical pinched nerve vs carpal tunnel syndrome or cubital tunnel syndrome. Vitamin B complex B6, B12 and thiamine.

## 2020-05-26 ENCOUNTER — Other Ambulatory Visit: Payer: Self-pay | Admitting: Cardiology

## 2020-05-26 DIAGNOSIS — I25118 Atherosclerotic heart disease of native coronary artery with other forms of angina pectoris: Secondary | ICD-10-CM

## 2020-05-26 DIAGNOSIS — Q245 Malformation of coronary vessels: Secondary | ICD-10-CM

## 2020-05-30 ENCOUNTER — Ambulatory Visit: Payer: BC Managed Care – PPO | Admitting: Specialist

## 2020-05-30 ENCOUNTER — Other Ambulatory Visit: Payer: Self-pay | Admitting: Cardiology

## 2020-05-31 ENCOUNTER — Other Ambulatory Visit: Payer: Self-pay | Admitting: Cardiology

## 2020-05-31 DIAGNOSIS — R0789 Other chest pain: Secondary | ICD-10-CM

## 2020-05-31 DIAGNOSIS — R002 Palpitations: Secondary | ICD-10-CM

## 2020-06-01 ENCOUNTER — Other Ambulatory Visit: Payer: Self-pay | Admitting: Cardiology

## 2020-06-01 DIAGNOSIS — I25118 Atherosclerotic heart disease of native coronary artery with other forms of angina pectoris: Secondary | ICD-10-CM

## 2020-06-01 DIAGNOSIS — Q245 Malformation of coronary vessels: Secondary | ICD-10-CM

## 2020-06-07 ENCOUNTER — Other Ambulatory Visit: Payer: Self-pay | Admitting: Cardiology

## 2020-06-07 DIAGNOSIS — K219 Gastro-esophageal reflux disease without esophagitis: Secondary | ICD-10-CM

## 2020-06-12 ENCOUNTER — Other Ambulatory Visit: Payer: Self-pay | Admitting: Cardiology

## 2020-06-12 DIAGNOSIS — R002 Palpitations: Secondary | ICD-10-CM

## 2020-06-12 DIAGNOSIS — R0789 Other chest pain: Secondary | ICD-10-CM

## 2020-06-13 ENCOUNTER — Telehealth: Payer: Self-pay | Admitting: Physical Medicine and Rehabilitation

## 2020-06-13 NOTE — Telephone Encounter (Signed)
Patient called stating she has been running a fever and need to reschedule her nerve study appt with Dr. Alvester Morin. Patient rescheduled his nerve study review 4/28 with Dr. Otelia Sergeant. Please call patient at [phone].

## 2020-06-13 NOTE — Telephone Encounter (Signed)
Called pt and sch 5/6

## 2020-06-14 ENCOUNTER — Encounter: Payer: BC Managed Care – PPO | Admitting: Physical Medicine and Rehabilitation

## 2020-06-20 ENCOUNTER — Ambulatory Visit: Payer: BC Managed Care – PPO | Admitting: Specialist

## 2020-07-02 NOTE — Telephone Encounter (Signed)
From patient.

## 2020-07-08 ENCOUNTER — Other Ambulatory Visit: Payer: Self-pay | Admitting: Specialist

## 2020-07-11 ENCOUNTER — Ambulatory Visit: Payer: BC Managed Care – PPO | Admitting: Specialist

## 2020-07-19 ENCOUNTER — Ambulatory Visit (INDEPENDENT_AMBULATORY_CARE_PROVIDER_SITE_OTHER): Payer: BC Managed Care – PPO | Admitting: Physical Medicine and Rehabilitation

## 2020-07-19 ENCOUNTER — Encounter: Payer: Self-pay | Admitting: Physical Medicine and Rehabilitation

## 2020-07-19 ENCOUNTER — Other Ambulatory Visit: Payer: Self-pay

## 2020-07-19 DIAGNOSIS — R202 Paresthesia of skin: Secondary | ICD-10-CM

## 2020-07-19 NOTE — Progress Notes (Signed)
Pt state pain and numbness in her left shoulder. Pt state pain in her neck, both hands and back. Pt state she has numbness in both pinky and ring fingers. Pt state she can't sleep on her left side. Pt state it wakes her out her sleep. Pt state she can't raise her left arm. Pt state she take pain meds to ease the feeling. Pt state she is right handed.  Numeric Pain Rating Scale and Functional Assessment Average Pain 9   In the last MONTH (on 0-10 scale) has pain interfered with the following?  1. General activity like being  able to carry out your everyday physical activities such as walking, climbing stairs, carrying groceries, or moving a chair?  Rating(10)

## 2020-07-19 NOTE — Procedures (Signed)
Impression: Essentially NORMAL electrodiagnostic study of both upper limbs.  There is no significant electrodiagnostic evidence of nerve entrapment, brachial plexopathy, cervical radiculopathy or generalized peripheral neuropathy.    As you know, purely sensory or demyelinating radiculopathies and chemical radiculitis may not be detected with this particular electrodiagnostic study. **This electrodiagnostic study cannot rule out small fiber polyneuropathy and dysesthesias from central pain syndromes such as stroke or central pain sensitization syndromes such as fibromyalgia.  Myotomal referral pain from trigger points is also not excluded.  Recommendations: 1.  Follow-up with referring physician. 2.  Continue current management of symptoms.  ___________________________ [name] FAAPMR Board Certified, American Board of Physical Medicine and Rehabilitation    Nerve Conduction Studies Anti Sensory Summary Table   Stim Site NR Peak (ms) Norm Peak (ms) P-T Amp (V) Norm P-T Amp Site1 Site2 Delta-P (ms) Dist (cm) Vel (m/s) Norm Vel (m/s)  Left Median Acr Palm Anti Sensory (2nd Digit)  29.3C  Wrist    3.1 <3.6 36.0 >10 Wrist Palm 1.4 0.0    Palm    1.7 <2.0 37.7         Right Median Acr Palm Anti Sensory (2nd Digit)  30.2C  Wrist    3.3 <3.6 40.4 >10 Wrist Palm 1.6 0.0    Palm    1.7 <2.0 50.5         Left Radial Anti Sensory (Base 1st Digit)  29.2C  Wrist    2.2 <3.1 29.4  Wrist Base 1st Digit 2.2 0.0    Right Radial Anti Sensory (Base 1st Digit)  29.9C  Wrist    2.3 <3.1 33.8  Wrist Base 1st Digit 2.3 0.0    Left Ulnar Anti Sensory (5th Digit)  29.9C  Wrist    3.3 <3.7 26.0 >15.0 Wrist 5th Digit 3.3 14.0 42 >38  Right Ulnar Anti Sensory (5th Digit)  30.3C  Wrist    3.1 <3.7 29.1 >15.0 Wrist 5th Digit 3.1 14.0 45 >38   Motor Summary Table   Stim Site NR Onset (ms) Norm Onset (ms) O-P Amp (mV) Norm O-P Amp Site1 Site2 Delta-0 (ms) Dist (cm) Vel (m/s) Norm Vel (m/s)  Left  Median Motor (Abd Poll Brev)  29.6C  Wrist    3.0 <4.2 8.2 >5 Elbow Wrist 3.7 19.5 53 >50  Elbow    6.7  7.9         Right Median Motor (Abd Poll Brev)  30C  Wrist    3.2 <4.2 7.5 >5 Elbow Wrist 3.8 19.0 50 >50  Elbow    7.0  6.6         Left Ulnar Motor (Abd Dig Min)  30C  Wrist    2.9 <4.2 8.0 >3 B Elbow Wrist 3.2 19.5 61 >53  B Elbow    6.1  7.7  A Elbow B Elbow 1.6 10.0 62 >53  A Elbow    7.7  7.0         Right Ulnar Motor (Abd Dig Min)  30.2C  Wrist    2.9 <4.2 9.6 >3 B Elbow Wrist 3.1 18.5 60 >53  B Elbow    6.0  8.7  A Elbow B Elbow 1.5 10.0 67 >53  A Elbow    7.5  8.1          EMG   Side Muscle Nerve Root Ins Act Fibs Psw Amp Dur Poly Recrt Int Dennie Bible Comment  Left Abd Poll Brev Median C8-T1 Nml Nml Nml Nml Nml  0 Nml Nml   Left 1stDorInt Ulnar C8-T1 Nml Nml Nml Nml Nml 0 Nml Nml   Left PronatorTeres Median C6-7 Nml Nml Nml Nml Nml 0 Nml Nml   Left Biceps Musculocut C5-6 Nml Nml Nml Nml Nml 0 Nml Nml   Left Deltoid Axillary C5-6 Nml Nml Nml Nml Nml 0 Nml Nml     Nerve Conduction Studies Anti Sensory Left/Right Comparison   Stim Site L Lat (ms) R Lat (ms) L-R Lat (ms) L Amp (V) R Amp (V) L-R Amp (%) Site1 Site2 L Vel (m/s) R Vel (m/s) L-R Vel (m/s)  Median Acr Palm Anti Sensory (2nd Digit)  29.3C  Wrist 3.1 3.3 0.2 36.0 40.4 10.9 Wrist Palm     Palm 1.7 1.7 0.0 37.7 50.5 25.3       Radial Anti Sensory (Base 1st Digit)  29.2C  Wrist 2.2 2.3 0.1 29.4 33.8 13.0 Wrist Base 1st Digit     Ulnar Anti Sensory (5th Digit)  29.9C  Wrist 3.3 3.1 0.2 26.0 29.1 10.7 Wrist 5th Digit 42 45 3   Motor Left/Right Comparison   Stim Site L Lat (ms) R Lat (ms) L-R Lat (ms) L Amp (mV) R Amp (mV) L-R Amp (%) Site1 Site2 L Vel (m/s) R Vel (m/s) L-R Vel (m/s)  Median Motor (Abd Poll Brev)  29.6C  Wrist 3.0 3.2 0.2 8.2 7.5 8.5 Elbow Wrist 53 50 3  Elbow 6.7 7.0 0.3 7.9 6.6 16.5       Ulnar Motor (Abd Dig Min)  30C  Wrist 2.9 2.9 0.0 8.0 9.6 16.7 B Elbow Wrist 61 60 1  B Elbow 6.1 6.0  0.1 7.7 8.7 11.5 A Elbow B Elbow 62 67 5  A Elbow 7.7 7.5 0.2 7.0 8.1 13.6          Waveforms:

## 2020-07-22 NOTE — Progress Notes (Signed)
[name] - 65 y.o. female MRN [id number]  Date of birth: [birth year]  Office Visit Note: Visit Date: 07/19/2020 PCP: Ralene Ok, MD Referred by: Ralene Ok, MD  Subjective: Chief Complaint  Patient presents with  . Neck - Pain  . Left Shoulder - Pain  . Right Hand - Pain, Numbness  . Left Hand - Pain, Numbness   HPI:  Chloe Garza is a 65 y.o. female who comes in today at the request of Dr. Vira Browns for electrodiagnostic study of the Bilateral upper extremities.  Patient is Right hand dominant.  She reports 9 out of 10 pain numbness and tingling particularly in the left shoulder but in both hands in a nondermatomal fashion but somewhat more in both the fifth digit and ring fingers.  She reports unable to sleep on her left side and it does wake her up at night with nocturnal complaints.  She uses pain medications to ease the numbness feeling.  She has no prior electrodiagnostic studies.  She does have type 2 diabetes and poor control.  ROS Otherwise per HPI.  Assessment & Plan: Visit Diagnoses:    ICD-10-CM   1. Paresthesia of skin  R20.2 NCV with EMG (electromyography)    Plan: Impression: Essentially NORMAL electrodiagnostic study of both upper limbs.  There is no significant electrodiagnostic evidence of nerve entrapment, brachial plexopathy, cervical radiculopathy or generalized peripheral neuropathy.    As you know, purely sensory or demyelinating radiculopathies and chemical radiculitis may not be detected with this particular electrodiagnostic study. **This electrodiagnostic study cannot rule out small fiber polyneuropathy and dysesthesias from central pain syndromes such as stroke or central pain sensitization syndromes such as fibromyalgia.  Myotomal referral pain from trigger points is also not excluded.  Recommendations: 1.  Follow-up with referring physician. 2.  Continue current management of symptoms.  Meds & Orders: No orders of the defined types  were placed in this encounter.   Orders Placed This Encounter  Procedures  . NCV with EMG (electromyography)    Follow-up: Return in about 2 weeks (around 08/02/2020) for Vira Browns, MD.   Procedures: No procedures performed  Impression: Essentially NORMAL electrodiagnostic study of both upper limbs.  There is no significant electrodiagnostic evidence of nerve entrapment, brachial plexopathy, cervical radiculopathy or generalized peripheral neuropathy.    As you know, purely sensory or demyelinating radiculopathies and chemical radiculitis may not be detected with this particular electrodiagnostic study. **This electrodiagnostic study cannot rule out small fiber polyneuropathy and dysesthesias from central pain syndromes such as stroke or central pain sensitization syndromes such as fibromyalgia.  Myotomal referral pain from trigger points is also not excluded.  Recommendations: 1.  Follow-up with referring physician. 2.  Continue current management of symptoms.  ___________________________ [name] FAAPMR Board Certified, American Board of Physical Medicine and Rehabilitation    Nerve Conduction Studies Anti Sensory Summary Table   Stim Site NR Peak (ms) Norm Peak (ms) P-T Amp (V) Norm P-T Amp Site1 Site2 Delta-P (ms) Dist (cm) Vel (m/s) Norm Vel (m/s)  Left Median Acr Palm Anti Sensory (2nd Digit)  29.3C  Wrist    3.1 <3.6 36.0 >10 Wrist Palm 1.4 0.0    Palm    1.7 <2.0 37.7         Right Median Acr Palm Anti Sensory (2nd Digit)  30.2C  Wrist    3.3 <3.6 40.4 >10 Wrist Palm 1.6 0.0    Palm    1.7 <2.0 50.5  Left Radial Anti Sensory (Base 1st Digit)  29.2C  Wrist    2.2 <3.1 29.4  Wrist Base 1st Digit 2.2 0.0    Right Radial Anti Sensory (Base 1st Digit)  29.9C  Wrist    2.3 <3.1 33.8  Wrist Base 1st Digit 2.3 0.0    Left Ulnar Anti Sensory (5th Digit)  29.9C  Wrist    3.3 <3.7 26.0 >15.0 Wrist 5th Digit 3.3 14.0 42 >38  Right Ulnar Anti Sensory (5th Digit)   30.3C  Wrist    3.1 <3.7 29.1 >15.0 Wrist 5th Digit 3.1 14.0 45 >38   Motor Summary Table   Stim Site NR Onset (ms) Norm Onset (ms) O-P Amp (mV) Norm O-P Amp Site1 Site2 Delta-0 (ms) Dist (cm) Vel (m/s) Norm Vel (m/s)  Left Median Motor (Abd Poll Brev)  29.6C  Wrist    3.0 <4.2 8.2 >5 Elbow Wrist 3.7 19.5 53 >50  Elbow    6.7  7.9         Right Median Motor (Abd Poll Brev)  30C  Wrist    3.2 <4.2 7.5 >5 Elbow Wrist 3.8 19.0 50 >50  Elbow    7.0  6.6         Left Ulnar Motor (Abd Dig Min)  30C  Wrist    2.9 <4.2 8.0 >3 B Elbow Wrist 3.2 19.5 61 >53  B Elbow    6.1  7.7  A Elbow B Elbow 1.6 10.0 62 >53  A Elbow    7.7  7.0         Right Ulnar Motor (Abd Dig Min)  30.2C  Wrist    2.9 <4.2 9.6 >3 B Elbow Wrist 3.1 18.5 60 >53  B Elbow    6.0  8.7  A Elbow B Elbow 1.5 10.0 67 >53  A Elbow    7.5  8.1          EMG   Side Muscle Nerve Root Ins Act Fibs Psw Amp Dur Poly Recrt Int Dennie Bible Comment  Left Abd Poll Brev Median C8-T1 Nml Nml Nml Nml Nml 0 Nml Nml   Left 1stDorInt Ulnar C8-T1 Nml Nml Nml Nml Nml 0 Nml Nml   Left PronatorTeres Median C6-7 Nml Nml Nml Nml Nml 0 Nml Nml   Left Biceps Musculocut C5-6 Nml Nml Nml Nml Nml 0 Nml Nml   Left Deltoid Axillary C5-6 Nml Nml Nml Nml Nml 0 Nml Nml     Nerve Conduction Studies Anti Sensory Left/Right Comparison   Stim Site L Lat (ms) R Lat (ms) L-R Lat (ms) L Amp (V) R Amp (V) L-R Amp (%) Site1 Site2 L Vel (m/s) R Vel (m/s) L-R Vel (m/s)  Median Acr Palm Anti Sensory (2nd Digit)  29.3C  Wrist 3.1 3.3 0.2 36.0 40.4 10.9 Wrist Palm     Palm 1.7 1.7 0.0 37.7 50.5 25.3       Radial Anti Sensory (Base 1st Digit)  29.2C  Wrist 2.2 2.3 0.1 29.4 33.8 13.0 Wrist Base 1st Digit     Ulnar Anti Sensory (5th Digit)  29.9C  Wrist 3.3 3.1 0.2 26.0 29.1 10.7 Wrist 5th Digit 42 45 3   Motor Left/Right Comparison   Stim Site L Lat (ms) R Lat (ms) L-R Lat (ms) L Amp (mV) R Amp (mV) L-R Amp (%) Site1 Site2 L Vel (m/s) R Vel (m/s) L-R Vel (m/s)   Median Motor (Abd Poll Brev)  29.6C  Wrist 3.0 3.2  0.2 8.2 7.5 8.5 Elbow Wrist 53 50 3  Elbow 6.7 7.0 0.3 7.9 6.6 16.5       Ulnar Motor (Abd Dig Min)  30C  Wrist 2.9 2.9 0.0 8.0 9.6 16.7 B Elbow Wrist 61 60 1  B Elbow 6.1 6.0 0.1 7.7 8.7 11.5 A Elbow B Elbow 62 67 5  A Elbow 7.7 7.5 0.2 7.0 8.1 13.6          Waveforms:                      Clinical History: No specialty comments available.     Objective:  VS:  HT:    WT:   BMI:     BP:   HR: bpm  TEMP: ( )  RESP:  Physical Exam Musculoskeletal:        General: No swelling, tenderness or deformity.     Comments: Inspection reveals no atrophy of the bilateral APB or FDI or hand intrinsics. There is no swelling, color changes, allodynia or dystrophic changes. There is 5 out of 5 strength in the bilateral wrist extension, finger abduction and long finger flexion. There is intact sensation to light touch in all dermatomal and peripheral nerve distributions. There is a negative Froment's test bilaterally.  There is a negative Hoffmann's test bilaterally.  Skin:    General: Skin is warm and dry.     Findings: No erythema or rash.  Neurological:     General: No focal deficit present.     Mental Status: She is alert and oriented to person, place, and time.     Motor: No weakness or abnormal muscle tone.     Coordination: Coordination normal.  Psychiatric:        Mood and Affect: Mood normal.        Behavior: Behavior normal.      Imaging: No results found.

## 2020-08-08 ENCOUNTER — Encounter: Payer: Self-pay | Admitting: Specialist

## 2020-08-08 ENCOUNTER — Other Ambulatory Visit: Payer: Self-pay

## 2020-08-08 ENCOUNTER — Ambulatory Visit (INDEPENDENT_AMBULATORY_CARE_PROVIDER_SITE_OTHER): Payer: BC Managed Care – PPO | Admitting: Specialist

## 2020-08-08 VITALS — BP 123/73 | HR 64 | Temp 98.3°F | Ht 64.0 in | Wt 160.0 lb

## 2020-08-08 DIAGNOSIS — M47819 Spondylosis without myelopathy or radiculopathy, site unspecified: Secondary | ICD-10-CM | POA: Diagnosis not present

## 2020-08-08 DIAGNOSIS — M542 Cervicalgia: Secondary | ICD-10-CM

## 2020-08-08 DIAGNOSIS — W19XXXS Unspecified fall, sequela: Secondary | ICD-10-CM

## 2020-08-08 DIAGNOSIS — M4722 Other spondylosis with radiculopathy, cervical region: Secondary | ICD-10-CM

## 2020-08-08 DIAGNOSIS — R202 Paresthesia of skin: Secondary | ICD-10-CM

## 2020-08-08 DIAGNOSIS — R200 Anesthesia of skin: Secondary | ICD-10-CM

## 2020-08-08 NOTE — Progress Notes (Signed)
Office Visit Note   Patient: Chloe Garza           Date of Birth: 06/11/1955           MRN: [id number] Visit Date: 08/08/2020              Requested by: Ralene Ok, MD [address] [place],  [place] [address] PCP: Ralene Ok, MD   Assessment & Plan: Visit Diagnoses:  1. Spondylosis without myelopathy or radiculopathy   2. Cervicalgia   3. Bilateral arm numbness and tingling while sleeping   4. Other spondylosis with radiculopathy, cervical region   5. Fall, sequela     Plan: Avoid overhead lifting and overhead use of the arms. Do not lift greater than 5 lbs. Adjust head rest in vehicle to prevent hyperextension if rear ended. Take extra precautions to avoid falling.Marland Kitchen MRI of the cervical spine to assess for stenosis  Follow-Up Instructions: No follow-ups on file.   Orders:  Orders Placed This Encounter  Procedures  . MR Cervical Spine w/o contrast   No orders of the defined types were placed in this encounter.     Procedures: No procedures performed   Clinical Data: No additional findings.   Subjective: No chief complaint on file.   65 year old female right handed with left arm and left hand numbness and tingling and pain into the left arm especially with raising the left arm upwards. She has difficulty sleeping on the left shoulder. No bowel difficulty, there is urgency and increased frequency of urination. She has diabetes and it does cause Thirst. Since stopping work she feels less chest pain. 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 She is applying for SSDI and has been out of work about 5/13, she was part time as a Corporate investment banker. She as intermittant palpitations.    Review of Systems   Objective: Vital Signs: BP 123/73 (BP Location: Left Arm, Patient Position: Sitting, Cuff Size: Normal)   Pulse 64   Temp 98.3 F (36.8 C) (Oral)   Ht 5\' 4"  (1.626 m)   Wt  160 lb (72.6 kg)   BMI 27.46 kg/m   Physical Exam  Ortho Exam  Specialty Comments:  No specialty comments available.  Imaging: No results found.   PMFS History: Patient Active Problem List   Diagnosis Date Noted  . Lightheadedness 10/05/2019  . Gastroesophageal reflux disease 09/13/2019  . 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 Post PTCA 07/06/2019  . Uncontrolled type 2 diabetes mellitus with hyperglycemia (HCC) 06/27/2019  . PAD (peripheral artery disease) (HCC) 06/27/2019  . Coronary artery disease of native artery of native heart with stable angina pectoris (HCC) 06/27/2019  . Tobacco abuse 02/08/2019  . Abnormal peripheral pulse 02/08/2019  . Mixed hyperglyceridemia 02/08/2019  . Stable angina (HCC) 02/08/2019  . Palpitations 02/08/2019  . Precordial pain 02/08/2019   Past Medical History:  Diagnosis Date  . Diabetes mellitus without complication (HCC)   . Hyperlipidemia   . Hypertension     Family History  Problem Relation Age of Onset  . Hypertension Mother   . Stroke Mother   . Alzheimer's disease Mother   . Heart attack Father   . Pneumonia Father   . CAD Sister   . Hypertension Sister   . Hypertension Brother   . Heart failure Brother   . Hypertension Sister   . Hypertension Sister   . Hypertension Sister   .  Hypertension Brother   . Heart failure Brother   . Hypertension Brother     Past Surgical History:  Procedure Laterality Date  . CATARACT EXTRACTION, BILATERAL    . CORONARY STENT INTERVENTION N/A 07/06/2019   Procedure: CORONARY STENT INTERVENTION;  Surgeon: Elder Negus, MD;  [facility]: [facility];  Service: Cardiovascular;  Laterality: N/A;  RCA prox  . CORONARY STENT INTERVENTION N/A 07/25/2019   Procedure: CORONARY STENT INTERVENTION;  Surgeon: Yates Decamp, MD;  Location: [facility];  Service: Cardiovascular;  Laterality: N/A;  . INTRAVASCULAR ULTRASOUND/IVUS N/A 07/06/2019   Procedure: Intravascular  Ultrasound/IVUS;  Surgeon: Elder Negus, MD;  Location: [facility];  Service: Cardiovascular;  Laterality: N/A;  . LEFT HEART CATH AND CORONARY ANGIOGRAPHY N/A 07/06/2019   Procedure: LEFT HEART CATH AND CORONARY ANGIOGRAPHY;  Surgeon: Elder Negus, MD;  Location: [facility];  Service: Cardiovascular;  Laterality: N/A;  . TONSILLECTOMY    . TUBAL LIGATION     Social History   Occupational History  . Not on file  Tobacco Use  . Smoking status: Former Smoker    Packs/day: 0.25    Years: 45.00    Pack years: 11.25    Types: Cigarettes    Quit date: 2021    Years since quitting: 1.3  . Smokeless tobacco: Never Used  . Tobacco comment: Patient is dow to once cigarette a day  Vaping Use  . Vaping Use: Never used  Substance and Sexual Activity  . Alcohol use: Not Currently  . Drug use: Never  . Sexual activity: Not on file

## 2020-09-04 ENCOUNTER — Ambulatory Visit
Admission: RE | Admit: 2020-09-04 | Discharge: 2020-09-04 | Disposition: A | Discharge status: Home / Self Care | Payer: BC Managed Care – PPO | Source: Ambulatory Visit | Attending: Specialist | Admitting: Specialist

## 2020-09-04 DIAGNOSIS — R200 Anesthesia of skin: Secondary | ICD-10-CM

## 2020-09-04 DIAGNOSIS — M47819 Spondylosis without myelopathy or radiculopathy, site unspecified: Secondary | ICD-10-CM

## 2020-09-06 ENCOUNTER — Ambulatory Visit (INDEPENDENT_AMBULATORY_CARE_PROVIDER_SITE_OTHER): Payer: BC Managed Care – PPO | Admitting: Specialist

## 2020-09-06 ENCOUNTER — Encounter: Payer: Self-pay | Admitting: Specialist

## 2020-09-06 ENCOUNTER — Ambulatory Visit: Payer: Self-pay

## 2020-09-06 ENCOUNTER — Other Ambulatory Visit: Payer: Self-pay

## 2020-09-06 VITALS — BP 117/74 | HR 64 | Ht 64.0 in | Wt 160.0 lb

## 2020-09-06 DIAGNOSIS — M778 Other enthesopathies, not elsewhere classified: Secondary | ICD-10-CM | POA: Diagnosis not present

## 2020-09-06 DIAGNOSIS — M4722 Other spondylosis with radiculopathy, cervical region: Secondary | ICD-10-CM

## 2020-09-06 DIAGNOSIS — R200 Anesthesia of skin: Secondary | ICD-10-CM

## 2020-09-06 DIAGNOSIS — M7542 Impingement syndrome of left shoulder: Secondary | ICD-10-CM

## 2020-09-06 DIAGNOSIS — M25512 Pain in left shoulder: Secondary | ICD-10-CM | POA: Diagnosis not present

## 2020-09-06 DIAGNOSIS — R202 Paresthesia of skin: Secondary | ICD-10-CM

## 2020-09-06 MED ORDER — METHYLPREDNISOLONE ACETATE 40 MG/ML IJ SUSP
40.0000 mg | INTRAMUSCULAR | Status: AC | PRN
  Administered 2020-09-06: 40 mg via INTRA_ARTICULAR

## 2020-09-06 MED ORDER — BUPIVACAINE HCL 0.5 % IJ SOLN
5.0000 mL | INTRAMUSCULAR | Status: AC | PRN
  Administered 2020-09-06: 5 mL via INTRA_ARTICULAR

## 2020-09-06 NOTE — Progress Notes (Addendum)
Office Visit Note   Patient: Chloe Garza           Date of Birth: Mar 16, 1956           MRN: [id number] Visit Date: 09/06/2020              Requested by: Ralene Ok, MD [address] [place],  [place] [address] PCP: Ralene Ok, MD   Assessment & Plan: Visit Diagnoses:  1. Acute pain of left shoulder   2. Shoulder tendonitis, left   3. Impingement syndrome of left shoulder   4. Other spondylosis with radiculopathy, cervical region   5. Bilateral arm numbness and tingling while sleeping     Plan: Avoid overhead lifting and overhead use of the arms. Do not lift greater than 5 lbs. Adjust head rest in vehicle to prevent hyperextension if rear ended. Take extra precautions to avoid falling. related to your current symptoms or signs.  Avoid overhead lifting and overhead use of the arms. Pillows to keep from sleeping directly on the shoulders Limited lifting to less than 10 lbs. Ice or heat for relief. NSAIDs are helpful, such as alleve or motrin, be careful not to use in excess as they place burdens on the kidney. Stretching exercise help and strengthening is helpful to build endurance.  Follow-Up Instructions: Return in about 3 months (around 12/07/2020).   Orders:  Orders Placed This Encounter  Procedures  . XR Shoulder Left   No orders of the defined types were placed in this encounter.     Procedures: Large Joint Inj: L subacromial bursa on 09/06/2020 3:48 PM Indications: pain Details: 25 G 1.5 in needle, posterior approach  Arthrogram: No  Medications: 40 mg methylPREDNISolone acetate 40 MG/ML; 5 mL bupivacaine 0.5 % Outcome: tolerated well, no immediate complications  Bandaid applied. Procedure, treatment alternatives, risks and benefits explained, specific risks discussed. Consent was given by the patient. Immediately prior to procedure a time out was called to verify the correct patient, procedure, equipment, support staff and site/side marked as  required. Patient was prepped and draped in the usual sterile fashion.     Clinical Data: No additional findings.   Subjective: Chief Complaint  Patient presents with  . Neck - Follow-up    65 year old right handed female with complaints of bilateral arm numbness and paresthesias, worse at night when lying down. She has chronic neck pain and history of spondylosis and treated in the distant past with therapy. The numbness into the ulnar digits is becoming more constant. She notices the numbness when she places her elbows on the kitchen table. Has been experiencing some weakness into both arms and dropping of items and increased clumbsiness. Results of recent MRI of the cervical spine are available for review. New complaint of left shoulder pain with lying on the left shoulder and with use of the left arm raising it over head and to wash her hair. Pain with active movement of the left arm. In the past she was treated for right shoulder bursitis and tendonitis with injections and therapy with good relief on the right side. No bowel or bladder difficulty. No gait disturbance or balance and coordination difficulties.   Review of Systems  Constitutional: Negative.   HENT: Negative.    Eyes: Negative.   Respiratory: Negative.    Cardiovascular: Negative.   Gastrointestinal: Negative.   Endocrine: Negative.   Genitourinary: Negative.   Musculoskeletal: Negative.   Skin: Negative.   Allergic/Immunologic: Negative.   Neurological: Negative.  Hematological: Negative.   Psychiatric/Behavioral: Negative.      Objective: Vital Signs: BP 117/74 (BP Location: Left Arm, Patient Position: Sitting)   Pulse 64   Ht 5\' 4"  (1.626 m)   Wt 160 lb (72.6 kg)   BMI 27.46 kg/m   Physical Exam Constitutional:      Appearance: She is well-developed.  HENT:     Head: Normocephalic and atraumatic.  Eyes:     Pupils: Pupils are equal, round, and reactive to light.  Pulmonary:     Effort: Pulmonary  effort is normal.     Breath sounds: Normal breath sounds.  Abdominal:     General: Bowel sounds are normal.     Palpations: Abdomen is soft.  Musculoskeletal:     Cervical back: Normal range of motion and neck supple.     Lumbar back: Negative right straight leg raise test and negative left straight leg raise test.  Skin:    General: Skin is warm and dry.  Neurological:     Mental Status: She is alert and oriented to person, place, and time.  Psychiatric:        Behavior: Behavior normal.        Thought Content: Thought content normal.        Judgment: Judgment normal.   Back Exam   Tenderness  The patient is experiencing tenderness in the cervical.  Range of Motion  Extension:  60 abnormal  Flexion:  70 abnormal  Lateral bend right:  60 abnormal  Lateral bend left:  50 abnormal  Rotation right:  60  Rotation left:  60   Muscle Strength  Right Quadriceps:  5/5  Left Quadriceps:  5/5  Right Hamstrings:  5/5  Left Hamstrings:  5/5   Tests  Straight leg raise right: negative Straight leg raise left: negative  Reflexes  Patellar:  2/4 Achilles:  2/4 Biceps:  normal Babinski's sign: normal   Other  Toe walk: normal Heel walk: normal Sensation: decreased Gait: normal  Erythema: no back redness Scars: absent  Comments:  Numbness is present both ulnar ring and little fingers. No papal sign, positive spurling sign.    Left Shoulder Exam   Tenderness  The patient is experiencing tenderness in the acromion.  Range of Motion  Active abduction:  120 abnormal  Passive abduction:  140  Extension:  30  External rotation:  70  Forward flexion:  120   Muscle Strength  Abduction: 4/5  Internal rotation: 5/5  External rotation: 5/5  Supraspinatus: 4/5  Subscapularis: 5/5  Biceps: 5/5   Tests  Impingement: positive Drop arm: positive  Other  Erythema: absent Scars: absent Sensation: normal Pulse: present   Comments:  Following injection strength in  left shoulder abduction improved.    Specialty Comments:  No specialty comments available.  Imaging: XR Shoulder Left  Result Date: 09/06/2020 AP lateral and outlet view of the left shoulder show no significant G-H and AC narrowing minimal spurs off the distal clavicle at the [facility] joint, the acromion is downsloping laterally with narrowing of the [facility] left shoulder. Type 2 acromion process.     PMFS History: Patient Active Problem List   Diagnosis Date Noted  . Lightheadedness 10/05/2019  . Gastroesophageal reflux disease 09/13/2019  . 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 Post PTCA 07/06/2019  . Uncontrolled type 2 diabetes mellitus with hyperglycemia (HCC) 06/27/2019  . PAD (peripheral artery disease) (HCC) 06/27/2019  . Coronary artery disease of native artery of native heart with stable  angina pectoris (HCC) 06/27/2019  . Tobacco abuse 02/08/2019  . Abnormal peripheral pulse 02/08/2019  . Mixed hyperglyceridemia 02/08/2019  . Stable angina (HCC) 02/08/2019  . Palpitations 02/08/2019  . Precordial pain 02/08/2019   Past Medical History:  Diagnosis Date  . Diabetes mellitus without complication (HCC)   . Hyperlipidemia   . Hypertension     Family History  Problem Relation Age of Onset  . Hypertension Mother   . Stroke Mother   . Alzheimer's disease Mother   . Heart attack Father   . Pneumonia Father   . CAD Sister   . Hypertension Sister   . Hypertension Brother   . Heart failure Brother   . Hypertension Sister   . Hypertension Sister   . Hypertension Sister   . Hypertension Brother   . Heart failure Brother   . Hypertension Brother     Past Surgical History:  Procedure Laterality Date  . CATARACT EXTRACTION, BILATERAL    . CORONARY STENT INTERVENTION N/A 07/06/2019   Procedure: CORONARY STENT INTERVENTION;  Surgeon: Elder Negus, MD;  Location: [facility];  Service: Cardiovascular;  Laterality: N/A;  RCA prox  . CORONARY STENT INTERVENTION  N/A 07/25/2019   Procedure: CORONARY STENT INTERVENTION;  Surgeon: Yates Decamp, MD;  Location: [facility];  Service: Cardiovascular;  Laterality: N/A;  . INTRAVASCULAR ULTRASOUND/IVUS N/A 07/06/2019   Procedure: Intravascular Ultrasound/IVUS;  Surgeon: Elder Negus, MD;  Location: [facility];  Service: Cardiovascular;  Laterality: N/A;  . LEFT HEART CATH AND CORONARY ANGIOGRAPHY N/A 07/06/2019   Procedure: LEFT HEART CATH AND CORONARY ANGIOGRAPHY;  Surgeon: Elder Negus, MD;  Location: [facility];  Service: Cardiovascular;  Laterality: N/A;  . TONSILLECTOMY    . TUBAL LIGATION     Social History   Occupational History  . Not on file  Tobacco Use  . Smoking status: Former    Packs/day: 0.25    Years: 45.00    Pack years: 11.25    Types: Cigarettes    Quit date: 2021    Years since quitting: 1.4  . Smokeless tobacco: Never  . Tobacco comments:    Patient is dow to once cigarette a day  Vaping Use  . Vaping Use: Never used  Substance and Sexual Activity  . Alcohol use: Not Currently  . Drug use: Never  . Sexual activity: Not on file

## 2020-09-06 NOTE — Patient Instructions (Signed)
Plan: Avoid overhead lifting and overhead use of the arms. Do not lift greater than 5 lbs. Adjust head rest in vehicle to prevent hyperextension if rear ended. Take extra precautions to avoid falling. related to your current symptoms or signs.  Avoid overhead lifting and overhead use of the arms. Pillows to keep from sleeping directly on the shoulders Limited lifting to less than 10 lbs. Ice or heat for relief. NSAIDs are helpful, such as alleve or motrin, be careful not to use in excess as they place burdens on the kidney. Stretching exercise help and strengthening is helpful to build endurance.  Follow-Up Instructions: Return in about 3 months (around 12/07/2020).   Shoulder Exercises Ask your health care provider which exercises are safe for you. Do exercises exactly as told by your health care provider and adjust them as directed. It is normal to feel mild stretching, pulling, tightness, or discomfort as you do these exercises. Stop right away if you feel sudden pain or your pain gets worse. Do not begin these exercises until told by your health care provider. Stretching exercises External rotation and abduction This exercise is sometimes called corner stretch. This exercise rotates your arm outward (external rotation) and moves your arm out from your body (abduction). Stand in a doorway with one of your feet slightly in front of the other. This is called a staggered stance. If you cannot reach your forearms to the door frame, stand facing a corner of a room. Choose one of the following positions as told by your health care provider: Place your hands and forearms on the door frame above your head. Place your hands and forearms on the door frame at the height of your head. Place your hands on the door frame at the height of your elbows. Slowly move your weight onto your front foot until you feel a stretch across your chest and in the front of your shoulders. Keep your head and chest upright  and keep your abdominal muscles tight. Hold for __________ seconds. To release the stretch, shift your weight to your back foot. Repeat __________ times. Complete this exercise __________ times a day. Extension, standing Stand and hold a broomstick, a cane, or a similar object behind your back. Your hands should be a little wider than shoulder width apart. Your palms should face away from your back. Keeping your elbows straight and your shoulder muscles relaxed, move the stick away from your body until you feel a stretch in your shoulders (extension). Avoid shrugging your shoulders while you move the stick. Keep your shoulder blades tucked down toward the middle of your back. Hold for __________ seconds. Slowly return to the starting position. Repeat __________ times. Complete this exercise __________ times a day. Range-of-motion exercises Pendulum  Stand near a wall or a surface that you can hold onto for balance. Bend at the waist and let your left / right arm hang straight down. Use your other arm to support you. Keep your back straight and do not lock your knees. Relax your left / right arm and shoulder muscles, and move your hips and your trunk so your left / right arm swings freely. Your arm should swing because of the motion of your body, not because you are using your arm or shoulder muscles. Keep moving your hips and trunk so your arm swings in the following directions, as told by your health care provider: Side to side. Forward and backward. In clockwise and counterclockwise circles. Continue each motion for __________ seconds,  or for as long as told by your health care provider. Slowly return to the starting position. Repeat __________ times. Complete this exercise __________ times a day. Shoulder flexion, standing  Stand and hold a broomstick, a cane, or a similar object. Place your hands a little more than shoulder width apart on the object. Your left / right hand should be  palm up, and your other hand should be palm down. Keep your elbow straight and your shoulder muscles relaxed. Push the stick up with your healthy arm to raise your left / right arm in front of your body, and then over your head until you feel a stretch in your shoulder (flexion). Avoid shrugging your shoulder while you raise your arm. Keep your shoulder blade tucked down toward the middle of your back. Hold for __________ seconds. Slowly return to the starting position. Repeat __________ times. Complete this exercise __________ times a day. Shoulder abduction, standing Stand and hold a broomstick, a cane, or a similar object. Place your hands a little more than shoulder width apart on the object. Your left / right hand should be palm up, and your other hand should be palm down. Keep your elbow straight and your shoulder muscles relaxed. Push the object across your body toward your left / right side. Raise your left / right arm to the side of your body (abduction) until you feel a stretch in your shoulder. Do not raise your arm above shoulder height unless your health care provider tells you to do that. If directed, raise your arm over your head. Avoid shrugging your shoulder while you raise your arm. Keep your shoulder blade tucked down toward the middle of your back. Hold for __________ seconds. Slowly return to the starting position. Repeat __________ times. Complete this exercise __________ times a day. Internal rotation  Place your left / right hand behind your back, palm up. Use your other hand to dangle an exercise band, a towel, or a similar object over your shoulder. Grasp the band with your left / right hand so you are holding on to both ends. Gently pull up on the band until you feel a stretch in the front of your left / right shoulder. The movement of your arm toward the center of your body is called internal rotation. Avoid shrugging your shoulder while you raise your arm. Keep your  shoulder blade tucked down toward the middle of your back. Hold for __________ seconds. Release the stretch by letting go of the band and lowering your hands. Repeat __________ times. Complete this exercise __________ times a day. Strengthening exercises External rotation  Sit in a stable chair without armrests. Secure an exercise band to a stable object at elbow height on your left / right side. Place a soft object, such as a folded towel or a small pillow, between your left / right upper arm and your body to move your elbow about 4 inches (10 cm) away from your side. Hold the end of the exercise band so it is tight and there is no slack. Keeping your elbow pressed against the soft object, slowly move your forearm out, away from your abdomen (external rotation). Keep your body steady so only your forearm moves. Hold for __________ seconds. Slowly return to the starting position. Repeat __________ times. Complete this exercise __________ times a day. Shoulder abduction  Sit in a stable chair without armrests, or stand up. Hold a __________ weight in your left / right hand, or hold an exercise band  with both hands. Start with your arms straight down and your left / right palm facing in, toward your body. Slowly lift your left / right hand out to your side (abduction). Do not lift your hand above shoulder height unless your health care provider tells you that this is safe. Keep your arms straight. Avoid shrugging your shoulder while you do this movement. Keep your shoulder blade tucked down toward the middle of your back. Hold for __________ seconds. Slowly lower your arm, and return to the starting position. Repeat __________ times. Complete this exercise __________ times a day. Shoulder extension Sit in a stable chair without armrests, or stand up. Secure an exercise band to a stable object in front of you so it is at shoulder height. Hold one end of the exercise band in each hand. Your  palms should face each other. Straighten your elbows and lift your hands up to shoulder height. Step back, away from the secured end of the exercise band, until the band is tight and there is no slack. Squeeze your shoulder blades together as you pull your hands down to the sides of your thighs (extension). Stop when your hands are straight down by your sides. Do not let your hands go behind your body. Hold for __________ seconds. Slowly return to the starting position. Repeat __________ times. Complete this exercise __________ times a day. Shoulder row Sit in a stable chair without armrests, or stand up. Secure an exercise band to a stable object in front of you so it is at waist height. Hold one end of the exercise band in each hand. Position your palms so that your thumbs are facing the ceiling (neutral position). Bend each of your elbows to a 90-degree angle (right angle) and keep your upper arms at your sides. Step back until the band is tight and there is no slack. Slowly pull your elbows back behind you. Hold for __________ seconds. Slowly return to the starting position. Repeat __________ times. Complete this exercise __________ times a day. Shoulder press-ups  Sit in a stable chair that has armrests. Sit upright, with your feet flat on the floor. Put your hands on the armrests so your elbows are bent and your fingers are pointing forward. Your hands should be about even with the sides of your body. Push down on the armrests and use your arms to lift yourself off the chair. Straighten your elbows and lift yourself up as much as you comfortably can. Move your shoulder blades down, and avoid letting your shoulders move up toward your ears. Keep your feet on the ground. As you get stronger, your feet should support less of your body weight as you lift yourself up. Hold for __________ seconds. Slowly lower yourself back into the chair. Repeat __________ times. Complete this exercise  __________ times a day. Wall push-ups  Stand so you are facing a stable wall. Your feet should be about one arm-length away from the wall. Lean forward and place your palms on the wall at shoulder height. Keep your feet flat on the floor as you bend your elbows and lean forward toward the wall. Hold for __________ seconds. Straighten your elbows to push yourself back to the starting position. Repeat __________ times. Complete this exercise __________ times a day. This information is not intended to replace advice given to you by your health care provider. Make sure you discuss any questions you have with your healthcare provider. Document Revised: 06/24/2018 Document Reviewed: 04/01/2018 Elsevier Patient Education  2022 [employer]  

## 2020-10-05 ENCOUNTER — Other Ambulatory Visit: Payer: Self-pay | Admitting: Cardiology

## 2020-10-05 DIAGNOSIS — I25118 Atherosclerotic heart disease of native coronary artery with other forms of angina pectoris: Secondary | ICD-10-CM

## 2020-10-05 DIAGNOSIS — Q245 Malformation of coronary vessels: Secondary | ICD-10-CM

## 2020-10-23 NOTE — Progress Notes (Signed)
Follow up visit  Subjective:   Chloe Garza, female    DOB: 1955-10-10, 65 y.o.   MRN: [id number]    HPI   65 y.o. Caucasian female with type 2 DM, prior tobacco dependence, CAD, myocardial bridge.  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  She is enjoying the retirement and spending time with grandchildren.  She continues to have chest pain with physical activity.  For her time, she has learned to adapt her lifestyle to avid having chest pain.  She is able to do most household things without any difficulty, but does experience chest pain with physical work such as vacuuming etc.  She has non-lifestyle limiting claudication symptoms.  On a positive note, she quit smoking 6 months ago.   Current Outpatient Medications on File Prior to Visit  Medication Sig Dispense Refill   acetaminophen (TYLENOL) 650 MG CR tablet Take 650-1,300 mg by mouth every 8 (eight) hours as needed for pain.     amLODipine (NORVASC) 10 MG tablet TAKE 1 TABLET(10 MG) BY MOUTH DAILY 30 tablet 3   Bioflavonoid Products (VITAMIN C) CHEW Chew 1 tablet by mouth daily.     cilostazol (PLETAL) 50 MG tablet TAKE 1 TABLET(50 MG) BY MOUTH TWICE DAILY 60 tablet 3   clopidogrel (PLAVIX) 75 MG tablet TAKE 1 TABLET BY MOUTH EVERY DAY 90 tablet 2   diclofenac (CATAFLAM) 50 MG tablet TAKE 1 TABLET(50 MG) BY MOUTH TWICE DAILY 60 tablet 1   gabapentin (NEURONTIN) 100 MG capsule Take 1 capsule (100 mg total) by mouth at bedtime. 30 capsule 3   Insulin Glargine, 2 Unit Dial, (TOUJEO MAX SOLOSTAR) 300 UNIT/ML SOPN Inject 20 Units into the skin daily.     Magnesium 100 MG CAPS Take 100 mg by mouth daily.     meclizine (ANTIVERT) 12.5 MG tablet Take 12.5 mg by mouth 3 (three) times daily as needed.     meloxicam (MOBIC) 15 MG tablet Take 15 mg by mouth daily.     metoprolol tartrate (LOPRESSOR) 50 MG tablet TAKE 1 TABLET(50 MG) BY MOUTH TWICE DAILY 180 tablet 2   Multiple  Vitamins-Minerals (ZINC PO) Take 1 tablet by mouth daily.     nitroGLYCERIN (NITROSTAT) 0.4 MG SL tablet DISSOLVE 1 TABLET UNDER THE TONGUE EVERY 5 MINUTES AS NEEDED FOR UP TO 25 DAYS FOR CHEST PAIN, IF NO RELIEF IN 15 MINUTES, CALL 911 25 tablet 3   pantoprazole (PROTONIX) 20 MG tablet TAKE 1 TABLET(20 MG) BY MOUTH DAILY 90 tablet 3   Potassium 99 MG TABS Take 99 mg by mouth daily.      rosuvastatin (CRESTOR) 10 MG tablet TAKE 1 TABLET(10 MG) BY MOUTH AT BEDTIME 90 tablet 2   Turmeric (QC TUMERIC COMPLEX PO) Take 1 capsule by mouth daily.      Vitamin D, Cholecalciferol, 10 MCG (400 UNIT) CHEW Chew 400 Units by mouth daily.      ZINC OXIDE PO Take 1 tablet by mouth daily.     No current facility-administered medications on file prior to visit.    Cardiovascular & other pertient studies:  EKG 10/24/2020: Sinus rhythm 62 bpm  RSR(V1) -nondiagnostic Possible  Anterolateral and inferior  ischemia No significant change compared to previous EKG's.  Coronary intervention  07/25/19: (Dr. Einar Gip) Successful PTCA and stenting of the proximal LAD with implantation of a 3.5 x 24 mm Synergy DES.  Stenosis reduced from 80% to 0% with TIMI-3 to TIMI-3  flow.  100 mL contrast utilized. Recommendation: Patient has significant intramyocardial bridging.  Very complex lesion, will place the patient under observation for tonight and discharged home in the morning.  Coronary intervention 07/06/2019: LM: Normal LAD: Tortuous vessel. Mid 80% stenosis, followed by a prominent myocardial bridge LCx: Normal Ramus: Normal RCA: Prox severe 80% stenosis        Mid 40% calcific disease  Intravascular ultrasound (IVUS) Successful percutaneous coronary intervention prox RCA     PTCA and stent placement Synergy DES 3.5 X 20 mm      Post dilatation with 4.0X15 mm Hitchcock balloon Unsuccessful Mynx closure Rt CFA requiring Femostop placement   Emergency re-look coronary angiography showed no acute vessel closure, patent RCA  stent. Successful Perclosre closure Lt CFA   LVEDP 15 mmHg LVEF >65%   Long term monitor 12 days 10/05/2019 - 10/17/2019: Dominant rhythm: Sinus. HR 48-158 bpm. Avg HR 59 bpm. 2 episodes of SVT, fastest at 113 bpm for 4 beats, longest for 7 beats at 91 bpm. 1% SVE burden. 1 episode of 4 beat NSVT <1% VE burden. No atrial fibrillation/atrial flutter/SVT/VT/high grade AV block, sinus pause >3sec noted. 0 patient triggered events.    Echocardiogram 02/17/2019:  Left ventricle cavity is normal in size. Mild concentric hypertrophy of  the left ventricle. Normal LV systolic function with EF 55%. Normal global  wall motion. Doppler evidence of grade I (impaired) diastolic dysfunction,  normal LAP.  No significant valvular abnormality.  No evidence of pulmonary hypertension.   ABI 02/17/2019: This exam reveals normal perfusion of the right lower extremity (ABI). This exam reveals normal perfusion of the left lower extremity (ABI).   Mildly abnormal biphasic waveform at the level of bilateral ankles.    Recent labs: 02/21/2020: Glucose 165, BUN/Cr 9/0.59. EGFR normal. Na/K 136/4.3. Rest of the CMP normal H/H 15/45. MCV 88. Platelets 222 HbA1C N/A Chol 141, TG 336, HDL 33, LDL 68 TSH 2.1 normal  07/26/2019: Glucose 128, BUN/Cr 12/0.85. EGFR 60. Na/K 139/4. Ca 8.6. Rest of the CMP normal H/H 11.5/34.5. MCV 91.7. Platelets 383.  01/20/2019: Glucose 217.  BUN/creatinine 11/0.64.  eGFR normal.  Sodium 137, potassium 3.9.  Rest of the CMP normal. H/H 14/42.  MCV 87.  Platelets 240. Cholesterol 218, triglycerides 293, HDL 44, LDL 114 Hemoglobin A1c 12.1%. ANA screen positive.   Review of Systems  Cardiovascular:  Positive for chest pain and claudication. Negative for dyspnea on exertion, leg swelling, palpitations and syncope.         Vitals:   10/24/20 0833  BP: 118/75  Pulse: 65  Resp: 16  Temp: 98 F (36.7 C)  SpO2: 98%      Body mass index is 27.64 kg/m. Filed  Weights   10/24/20 0833  Weight: 161 lb (73 kg)      Objective:  Physical Exam Vitals and nursing note reviewed.  Constitutional:      General: She is not in acute distress. Neck:     Vascular: No JVD.  Cardiovascular:     Rate and Rhythm: Normal rate and regular rhythm.     Pulses:          Femoral pulses are 1+ on the right side and 1+ on the left side.      Popliteal pulses are 0 on the right side and 0 on the left side.       Dorsalis pedis pulses are 0 on the right side and 0 on the left side.  Posterior tibial pulses are 0 on the right side and 0 on the left side.     Heart sounds: Normal heart sounds. No murmur heard. Pulmonary:     Effort: Pulmonary effort is normal.     Breath sounds: Normal breath sounds. No wheezing or rales.  Musculoskeletal:     Right lower leg: No edema.     Left lower leg: No edema.         Assessment & Recommendations:   65 y.o. Caucasian female with type 2 DM, prior tobacco dependence, CAD, myocardial bridge.  CAD: Successful PCI to RCA and LAD (2021).  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 I reckon that this is causing some degree of resting ischemia and chest pain.  Treatment remains primarily medical management.   Continue metoprolol tartrate 50 mg twice daily, amlodipine 10 mg daily. As discussed previously, patient does not want to consider repeat stress testing, cardiac catheterization, or any high risk surgery for her myocardial bridge related symptoms; unless she is absolutely intolerant with her symptoms.  PAD: Based on her symptoms and physical exam, she likely has aortoiliac disease.  No critical limb ischemia at this time.  As far as possible, patient would like to avoid procedures.  I explained to her to inspect her feet regularly.  Angiography and revascularization can be considered, should she develop lifestyle limiting claudication, resting leg pain, or critical limb ischemia. Continue Plavix, Crestor.   She has not tolerated cilostazol in the past due to dizziness.  Type 2 diabetes mellitus: Continue follow-up with PCP.   F/u in 6 months  Josclyn Rosales Esther Hardy, MD [facility] Pager: [phone] Office: [phone]

## 2020-10-24 ENCOUNTER — Other Ambulatory Visit: Payer: Self-pay

## 2020-10-24 ENCOUNTER — Encounter: Payer: Self-pay | Admitting: Cardiology

## 2020-10-24 ENCOUNTER — Ambulatory Visit: Payer: BC Managed Care – PPO | Admitting: Cardiology

## 2020-10-24 VITALS — BP 118/75 | HR 65 | Temp 98.0°F | Resp 16 | Ht 64.0 in | Wt 161.0 lb

## 2020-10-24 DIAGNOSIS — I739 Peripheral vascular disease, unspecified: Secondary | ICD-10-CM

## 2020-10-24 DIAGNOSIS — I25118 Atherosclerotic heart disease of native coronary artery with other forms of angina pectoris: Secondary | ICD-10-CM

## 2020-10-24 DIAGNOSIS — Q245 Malformation of coronary vessels: Secondary | ICD-10-CM

## 2020-10-28 ENCOUNTER — Other Ambulatory Visit: Payer: Self-pay | Admitting: Cardiology

## 2020-11-07 ENCOUNTER — Encounter: Payer: Self-pay | Admitting: Surgery

## 2020-11-07 ENCOUNTER — Other Ambulatory Visit: Payer: Self-pay

## 2020-11-07 ENCOUNTER — Ambulatory Visit (INDEPENDENT_AMBULATORY_CARE_PROVIDER_SITE_OTHER): Payer: BC Managed Care – PPO | Admitting: Surgery

## 2020-11-07 VITALS — BP 122/79 | HR 82 | Ht 64.0 in | Wt 161.0 lb

## 2020-11-07 DIAGNOSIS — M7542 Impingement syndrome of left shoulder: Secondary | ICD-10-CM | POA: Diagnosis not present

## 2020-11-07 DIAGNOSIS — M4722 Other spondylosis with radiculopathy, cervical region: Secondary | ICD-10-CM | POA: Diagnosis not present

## 2020-11-07 NOTE — Progress Notes (Signed)
Office Visit Note   Patient: Chloe Garza           Date of Birth: Sep 24, 1955           MRN: [id number] Visit Date: 11/07/2020              Requested by: Ralene Ok, MD [address] [place],  [place] [address] PCP: Ralene Ok, MD   Assessment & Plan: Visit Diagnoses:  1. Impingement syndrome of left shoulder   2. Other spondylosis with radiculopathy, cervical region   Possible left shoulder adhesive capsulitis   Plan: The patient's ongoing left shoulder pain that is failed conservative treatment with previous subacromial injection I recommend getting a MRI to rule out rotator cuff tear and adhesive capsulitis.  Patient will follow-up with Dr. Otelia Sergeant in 4 weeks for recheck and he will discuss results of that MRI at that time.  If she does have something that needs possible surgical intervention I discussed referral to Dr. Dorene Grebe here in our office.  He regards to ongoing issues with her neck I will put in a consult with Dr. Alvester Morin to review her scan and discuss whether or not she is a good candidate for cervical ESI's.  Patient did state that she has had cervical ESI's in the past and did not want these repeated but I told her that it would be worthwhile to speak with him.  Follow-Up Instructions: Return in about 4 weeks (around 12/05/2020) for With Dr. Otelia Sergeant to review left shoulder MRI and discuss further treatment options for cervical spine..   Orders:  Orders Placed This Encounter  Procedures   MR Shoulder Left w/o contrast   Ambulatory referral to Physical Medicine Rehab   No orders of the defined types were placed in this encounter.     Procedures: No procedures performed   Clinical Data: No additional findings.   Subjective: Chief Complaint  Patient presents with   Left Shoulder - Pain    HPI 65 year old white female returns with complaints of left shoulder pain and neck pain and upper extremity radiculopathy.  Patient has known history of multilevel  cervical spondylosis and previous MRI was reviewed with Dr. Otelia Sergeant.  Patient is awaiting scheduling of upper extremity NCV/EMG studies.  Continues have ongoing pain and stiffness in her left shoulder.  Cannot raise her arm overhead.  Marked discomfort with attempted overhead movements and also when she lays on her left side.  Subacromial injection performed last office visit did not give any improvement. Review of Systems No current cardiac pulmonary GI GU issues  Objective: Vital Signs: BP 122/79   Pulse 82   Ht 5\' 4"  (1.626 m)   Wt 161 lb (73 kg)   BMI 27.64 kg/m   Physical Exam HENT:     Head: Normocephalic and atraumatic.  Eyes:     Extraocular Movements: Extraocular movements intact.  Pulmonary:     Effort: No respiratory distress.  Musculoskeletal:     Comments: Exam left shoulder active flexion/abduction to about 80 to 90 degrees with discomfort.  Passively I can get her a little further but this is very painful.  Positive impingement test.  Negative drop arm test.  Pain and weakness with supraspinatus resistance.  Right shoulder unremarkable.  Neurological:     Mental Status: She is alert and oriented to person, place, and time.  Psychiatric:        Mood and Affect: Mood normal.    Ortho Exam  Specialty Comments:  No  specialty comments available.  Imaging: No results found.   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   Post PTCA 07/06/2019   Uncontrolled type 2 diabetes mellitus with hyperglycemia (HCC) 06/27/2019   PAD (peripheral artery disease) (HCC) 06/27/2019   Coronary artery disease of native artery of native heart with stable angina pectoris (HCC) 06/27/2019   Tobacco abuse 02/08/2019   Abnormal peripheral pulse 02/08/2019   Mixed hyperglyceridemia 02/08/2019   Stable angina (HCC) 02/08/2019   Palpitations 02/08/2019    Precordial pain 02/08/2019   Past Medical History:  Diagnosis Date   Diabetes mellitus without complication (HCC)    Hyperlipidemia    Hypertension     Family History  Problem Relation Age of Onset   Hypertension Mother    Stroke Mother    Alzheimer's disease Mother    Heart attack Father    Pneumonia Father    CAD Sister    Hypertension Sister    Hypertension Brother    Heart failure Brother    Hypertension Sister    Hypertension Sister    Hypertension Sister    Hypertension Brother    Heart failure Brother    Hypertension Brother     Past Surgical History:  Procedure Laterality Date   CATARACT EXTRACTION, BILATERAL     CORONARY STENT INTERVENTION N/A 07/06/2019   Procedure: CORONARY STENT INTERVENTION;  Surgeon: Elder Negus, MD;  Location: [facility];  Service: Cardiovascular;  Laterality: N/A;  RCA prox   CORONARY STENT INTERVENTION N/A 07/25/2019   Procedure: CORONARY STENT INTERVENTION;  Surgeon: Yates Decamp, MD;  Location: [facility];  Service: Cardiovascular;  Laterality: N/A;   INTRAVASCULAR ULTRASOUND/IVUS N/A 07/06/2019   Procedure: Intravascular Ultrasound/IVUS;  Surgeon: Elder Negus, MD;  Location: [facility];  Service: Cardiovascular;  Laterality: N/A;   LEFT HEART CATH AND CORONARY ANGIOGRAPHY N/A 07/06/2019   Procedure: LEFT HEART CATH AND CORONARY ANGIOGRAPHY;  Surgeon: Elder Negus, MD;  Location: [facility];  Service: Cardiovascular;  Laterality: N/A;   TONSILLECTOMY     TUBAL LIGATION     Social History   Occupational History   Not on file  Tobacco Use   Smoking status: Former    Packs/day: 0.25    Years: 45.00    Pack years: 11.25    Types: Cigarettes    Quit date: 2021    Years since quitting: 1.6   Smokeless tobacco: Never   Tobacco comments:    Patient is dow to once cigarette a day  Vaping Use   Vaping Use: Never used  Substance and Sexual Activity   Alcohol use: Not Currently    Drug use: Never   Sexual activity: Not on file

## 2020-11-13 ENCOUNTER — Other Ambulatory Visit: Payer: Self-pay

## 2020-11-13 ENCOUNTER — Encounter: Payer: Self-pay | Admitting: Physical Medicine and Rehabilitation

## 2020-11-13 ENCOUNTER — Ambulatory Visit: Payer: BC Managed Care – PPO | Admitting: Physical Medicine and Rehabilitation

## 2020-11-13 VITALS — BP 100/68 | HR 71

## 2020-11-13 DIAGNOSIS — G8929 Other chronic pain: Secondary | ICD-10-CM

## 2020-11-13 DIAGNOSIS — M4802 Spinal stenosis, cervical region: Secondary | ICD-10-CM

## 2020-11-13 DIAGNOSIS — M79622 Pain in left upper arm: Secondary | ICD-10-CM

## 2020-11-13 DIAGNOSIS — M5412 Radiculopathy, cervical region: Secondary | ICD-10-CM

## 2020-11-13 NOTE — Progress Notes (Signed)
Chloe Garza - 65 y.o. female MRN [id number]  Date of birth: October 30, 1955  Office Visit Note: Visit Date: 11/13/2020 PCP: Ralene Ok, MD Referred by: Ralene Ok, MD  Subjective: Chief Complaint  Patient presents with   Neck - Pain   Left Shoulder - Pain   Left Arm - Pain   HPI: Chloe Garza is a 65 y.o. female who comes in today At the request of Zonia Kief, [place] for evaluation of chronic, worsening and severe bilateral neck pain radiating to left shoulder and upper arm. Patient reports the most severe pain is to left upper arm.  Patient reports pain has been chronic for several years.  Patient reports pain is exacerbated by movement and activity particularly shoulder abduction.  Patient describes pain as stiffness sensation and currently rates as 8 out of 10.  Patient reports some relief of pain with the use of Tylenol and rest.  Patient's recent cervical MRI exhibits diffuse cervical spine spondylosis, central canal stenosis worse at C5-C6 and C6-C7.  Patient was recently seen by Dr. Vira Browns in June where she had left subacromial bursa injection performed.  Patient states she could not tell that this injection helped to alleviate her pain.  She did have x-ray of the left shoulder not revealing much in the way of arthritic changes.  She has not had MRI of the shoulder.  Patient had normal electrodiagnostic study of bilateral upper limbs performed in May. Patient reports she is having difficulty performing daily tasks and is now having trouble sleeping due to increased pain. Patient states she did have cervical epidural injection, but states this was many years ago and is unable to recall if this helped with pain. Patient denies focal weakness, numbness and tingling. Patient denies recent trauma or falls.  Her case is complicated by peripheral artery disease, coronary artery disease and type 2 diabetes.  Review of Systems  Musculoskeletal:  Positive for joint pain and neck pain.   Neurological:  Negative for tingling, sensory change, focal weakness and weakness.  All other systems reviewed and are negative. Otherwise per HPI.  Assessment & Plan: Visit Diagnoses:    ICD-10-CM   1. Radiculopathy, cervical region  M54.12     2. Chronic left shoulder pain  M25.512    G89.29     3. Left upper arm pain  M79.622     4. Spinal stenosis of cervical region  M48.02        Plan: Findings:  Chronic, worsening and severe bilateral neck pain radiating to left shoulder and upper arm. Patient's clinical presentation and exam are consistent with C5 distribution vs glenohumeral joint etiology. Patient has failed conservative therapies and continues to have severe pain to left upper arm with movement. We discussed patient's recent MRI using spine model today. We believe the next step is to perform a diagnostic and hopefully therapeutic left intra-articular glenohumeral injection with fluoroscopic guidance.  She does seem to have pain with abduction of the shoulder which we typically do not see with nerve root pain.  If patient does not get significant relief from left glenohumeral joint injection we would likely perform diagnostic C7-T1 interlaminar epidural steroid injection. Patient instructed to take 500 mg Tylenol 4 times a day for pain management. No red flag symptoms noted today upon exam.    Meds & Orders: No orders of the defined types were placed in this encounter.  No orders of the defined types were placed in this encounter.   Follow-up:  Return in about 1 week (around 11/20/2020) for Left intra-articular glenohumeral joint injection.   Procedures: No procedures performed      Clinical History: No specialty comments available.   She reports that she quit smoking about 20 months ago. Her smoking use included cigarettes. She has a 11.25 pack-year smoking history. She has never used smokeless tobacco. No results for input(s): HGBA1C, LABURIC in the last 8760  hours.  Objective:  VS:  HT:    WT:   BMI:     BP:100/68  HR:71bpm  TEMP: ( )  RESP:  Physical Exam HENT:     Head: Normocephalic and atraumatic.     Right Ear: Tympanic membrane normal.     Left Ear: Tympanic membrane normal.     Nose: Nose normal.     Mouth/Throat:     Mouth: Mucous membranes are moist.  Eyes:     Pupils: Pupils are equal, round, and reactive to light.  Cardiovascular:     Rate and Rhythm: Normal rate.     Pulses: Normal pulses.  Pulmonary:     Effort: Pulmonary effort is normal.  Abdominal:     General: Abdomen is flat. There is no distension.  Musculoskeletal:        General: Tenderness present.     Cervical back: Tenderness present.     Comments: Discomfort noted with flexion, extension and side-to-side rotation. Good strength noted to bilateral upper extremities. Sensation intact bilaterally. Negative Hoffman's sign.    Left upper arm pain noted with shoulder abduction. Positive left impingement sign.   Skin:    General: Skin is warm and dry.     Capillary Refill: Capillary refill takes less than 2 seconds.  Neurological:     General: No focal deficit present.     Mental Status: She is alert.  Psychiatric:        Mood and Affect: Mood normal.    Ortho Exam  Imaging: No results found.  Past Medical/Family/Surgical/Social History: Medications & Allergies reviewed per EMR, new medications updated. 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   Post PTCA 07/06/2019   Uncontrolled type 2 diabetes mellitus with hyperglycemia (HCC) 06/27/2019   PAD (peripheral artery disease) (HCC) 06/27/2019   Coronary artery disease of native artery of native heart with stable angina pectoris (HCC) 06/27/2019   Tobacco abuse 02/08/2019   Abnormal peripheral pulse 02/08/2019   Mixed hyperglyceridemia 02/08/2019   Stable angina (HCC)  02/08/2019   Palpitations 02/08/2019   Precordial pain 02/08/2019   Past Medical History:  Diagnosis Date   Diabetes mellitus without complication (HCC)    Hyperlipidemia    Hypertension    Family History  Problem Relation Age of Onset   Hypertension Mother    Stroke Mother    Alzheimer's disease Mother    Heart attack Father    Pneumonia Father    CAD Sister    Hypertension Sister    Hypertension Brother    Heart failure Brother    Hypertension Sister    Hypertension Sister    Hypertension Sister    Hypertension Brother    Heart failure Brother    Hypertension Brother    Past Surgical History:  Procedure Laterality Date   CATARACT EXTRACTION, BILATERAL     CORONARY STENT INTERVENTION N/A 07/06/2019   Procedure: CORONARY STENT INTERVENTION;  Surgeon: Elder Negus, MD;  [facility]: [facility];  Service: Cardiovascular;  Laterality: N/A;  RCA prox   CORONARY STENT INTERVENTION N/A 07/25/2019   Procedure: CORONARY STENT INTERVENTION;  Surgeon: Yates Decamp, MD;  [facility]: [facility];  Service: Cardiovascular;  Laterality: N/A;   INTRAVASCULAR ULTRASOUND/IVUS N/A 07/06/2019   Procedure: Intravascular Ultrasound/IVUS;  Surgeon: Elder Negus, MD;  [facility];  Service: Cardiovascular;  Laterality: N/A;   LEFT HEART CATH AND CORONARY ANGIOGRAPHY N/A 07/06/2019   Procedure: LEFT HEART CATH AND CORONARY ANGIOGRAPHY;  Surgeon: Elder Negus, MD;  Location: [facility];  Service: Cardiovascular;  Laterality: N/A;   TONSILLECTOMY     TUBAL LIGATION     Social History   Occupational History   Not on file  Tobacco Use   Smoking status: Former    Packs/day: 0.25    Years: 45.00    Pack years: 11.25    Types: Cigarettes    Quit date: 2021    Years since quitting: 1.6   Smokeless tobacco: Never   Tobacco comments:    Patient is dow to once cigarette a day  Vaping Use   Vaping Use: Never used  Substance and Sexual  Activity   Alcohol use: Not Currently   Drug use: Never   Sexual activity: Not on file

## 2020-11-13 NOTE — Progress Notes (Signed)
Pt state neck pain that travels down her left shoulder and posterior upper arm pain. Pt state with she lift her arm the pain gets worse. Pt state she takes over the counter pain meds and pain cream to help ease her pain.   Numeric Pain Rating Scale and Functional Assessment Average Pain 10 Pain Right Now 8 My pain is intermittent and sharp Pain is worse with: some activites Pain improves with: medication   In the last MONTH (on 0-10 scale) has pain interfered with the following?  1. General activity like being  able to carry out your everyday physical activities such as walking, climbing stairs, carrying groceries, or moving a chair?  Rating(6)  2. Relation with others like being able to carry out your usual social activities and roles such as  activities at home, at work and in your community. Rating(7)  3. Enjoyment of life such that you have  been bothered by emotional problems such as feeling anxious, depressed or irritable?  Rating(8)

## 2020-11-14 ENCOUNTER — Ambulatory Visit: Payer: Self-pay

## 2020-11-14 ENCOUNTER — Ambulatory Visit (INDEPENDENT_AMBULATORY_CARE_PROVIDER_SITE_OTHER): Payer: BC Managed Care – PPO | Admitting: Physical Medicine and Rehabilitation

## 2020-11-14 ENCOUNTER — Encounter: Payer: Self-pay | Admitting: Physical Medicine and Rehabilitation

## 2020-11-14 DIAGNOSIS — M25512 Pain in left shoulder: Secondary | ICD-10-CM | POA: Diagnosis not present

## 2020-11-14 DIAGNOSIS — G8929 Other chronic pain: Secondary | ICD-10-CM

## 2020-11-14 DIAGNOSIS — M4802 Spinal stenosis, cervical region: Secondary | ICD-10-CM | POA: Diagnosis not present

## 2020-11-14 DIAGNOSIS — M542 Cervicalgia: Secondary | ICD-10-CM | POA: Diagnosis not present

## 2020-11-14 NOTE — Progress Notes (Signed)
   [name] - 65 y.o. female MRN [id number]  Date of birth: 1955/08/30  Office Visit Note: Visit Date: 11/14/2020 PCP: Ralene Ok, MD Referred by: Ralene Ok, MD  Subjective: Chief Complaint  Patient presents with   Left Shoulder - Pain   HPI:  Chloe Garza is a 65 y.o. female who comes in today at the request of Ellin Goodie, FNP for planned Left anesthetic glenohumeral arthrogram with fluoroscopic guidance.  The patient has failed conservative care including home exercise, medications, time and activity modification.  This injection will be diagnostic and hopefully therapeutic.  Please see requesting physician notes for further details and justification.  Brief review as patient has left shoulder pain and reduced range of motion particular shoulder abduction.  She underwent subacromial injection by Dr. Otelia Sergeant without much relief at all.  She was referred here by Zonia Kief for evaluation of neck and shoulder pain with cervical MRI.  We decided to complete the injection today diagnostically just because she was having such pain with movement of the shoulder.  As noted in the procedure note the patient did have significant increased range of motion after intra-articular injection of the shoulder.  We will continue to monitor and would look at neck injection depending on her relief of symptoms.  She is following up with Dr. Otelia Sergeant soon.   ROS Otherwise per HPI.  Assessment & Plan: Visit Diagnoses:    ICD-10-CM   1. Chronic left shoulder pain  M25.512 Large Joint Inj: L glenohumeral   G89.29 XR C-ARM NO REPORT      Plan: No additional findings.   Meds & Orders: No orders of the defined types were placed in this encounter.   Orders Placed This Encounter  Procedures   Large Joint Inj: L glenohumeral   XR C-ARM NO REPORT    Follow-up: Return for Vira Browns, MD as scheduled.   Procedures: Large Joint Inj: L glenohumeral on 11/14/2020 3:24 PM Indications: pain and  diagnostic evaluation Details: 22 G 3.5 in needle, fluoroscopy-guided anteromedial approach  Arthrogram: No  Medications: 3 mL bupivacaine 0.5 %; 60 mg triamcinolone acetonide 40 MG/ML Outcome: tolerated well, no immediate complications  There was excellent flow of contrast producing a partial arthrogram of the glenohumeral joint. The patient did have relief of symptoms during the anesthetic phase of the injection with significant increased range of motion.  Still some limitation in overall abduction. Procedure, treatment alternatives, risks and benefits explained, specific risks discussed. Consent was given by the patient. Immediately prior to procedure a time out was called to verify the correct patient, procedure, equipment, support staff and site/side marked as required. Patient was prepped and draped in the usual sterile fashion.         Clinical History: No specialty comments available.     Objective:  VS:  HT:    WT:   BMI:     BP:   HR: bpm  TEMP: ( )  RESP:  Physical Exam Musculoskeletal:     Comments: Painful range of motion of the left shoulder particularly with decreased range of motion and shoulder abduction.     Imaging: XR C-ARM NO REPORT  Result Date: 11/14/2020 Please see Notes tab for imaging impression.

## 2020-11-14 NOTE — Progress Notes (Signed)
Pt state left shoulder pain. Pt state any uses of her left arm or shoulder the pain gets worse. Pt state she takes over the counter pain meds to help ease her pain.  Numeric Pain Rating Scale and Functional Assessment Average Pain 3   In the last MONTH (on 0-10 scale) has pain interfered with the following?  1. General activity like being  able to carry out your everyday physical activities such as walking, climbing stairs, carrying groceries, or moving a chair?  Rating(8)   -BT, -Dye Allergies.

## 2020-11-15 MED ORDER — BUPIVACAINE HCL 0.5 % IJ SOLN
3.0000 mL | INTRAMUSCULAR | Status: AC | PRN
  Administered 2020-11-14: 3 mL via INTRA_ARTICULAR

## 2020-11-15 MED ORDER — TRIAMCINOLONE ACETONIDE 40 MG/ML IJ SUSP
60.0000 mg | INTRAMUSCULAR | Status: AC | PRN
  Administered 2020-11-14: 60 mg via INTRA_ARTICULAR

## 2020-11-23 ENCOUNTER — Ambulatory Visit
Admission: RE | Admit: 2020-11-23 | Discharge: 2020-11-23 | Disposition: A | Discharge status: Home / Self Care | Payer: BC Managed Care – PPO | Source: Ambulatory Visit | Attending: Surgery | Admitting: Surgery

## 2020-11-23 ENCOUNTER — Other Ambulatory Visit: Payer: Self-pay

## 2020-11-23 DIAGNOSIS — M7542 Impingement syndrome of left shoulder: Secondary | ICD-10-CM

## 2020-12-06 ENCOUNTER — Encounter: Payer: Self-pay | Admitting: Specialist

## 2020-12-06 ENCOUNTER — Ambulatory Visit (INDEPENDENT_AMBULATORY_CARE_PROVIDER_SITE_OTHER): Payer: BC Managed Care – PPO | Admitting: Specialist

## 2020-12-06 ENCOUNTER — Other Ambulatory Visit: Payer: Self-pay

## 2020-12-06 VITALS — BP 122/79 | HR 79 | Ht 64.0 in | Wt 161.0 lb

## 2020-12-06 DIAGNOSIS — M4722 Other spondylosis with radiculopathy, cervical region: Secondary | ICD-10-CM

## 2020-12-06 DIAGNOSIS — R200 Anesthesia of skin: Secondary | ICD-10-CM

## 2020-12-06 DIAGNOSIS — M778 Other enthesopathies, not elsewhere classified: Secondary | ICD-10-CM | POA: Diagnosis not present

## 2020-12-06 DIAGNOSIS — R202 Paresthesia of skin: Secondary | ICD-10-CM

## 2020-12-06 DIAGNOSIS — M7542 Impingement syndrome of left shoulder: Secondary | ICD-10-CM | POA: Diagnosis not present

## 2020-12-06 NOTE — Progress Notes (Signed)
Office Visit Note   Patient: Chloe Garza           Date of Birth: 12-Jun-1955           MRN: [id number] Visit Date: 12/06/2020              Requested by: Ralene Ok, MD [address] [place],  [place] [address] PCP: Ralene Ok, MD   Assessment & Plan: Visit Diagnoses:  1. Shoulder tendonitis, left   2. Other spondylosis with radiculopathy, cervical region   3. Impingement syndrome of left shoulder   4. Bilateral arm numbness and tingling while sleeping     Plan: Cubital tunnel syndrome is usually better with holding the elbow in extension and use of vitamin B complex may be of benefit. MRI of the shoulder shows arthrits at the end of the collar bone and mild tendontiis involving the rotator cuff.  EMG/NCV help [place] to sort out if the nerves are irritated at the elbow or if it originates from the neck.   Follow-Up Instructions: No follow-ups on file.   Orders:  No orders of the defined types were placed in this encounter.  No orders of the defined types were placed in this encounter.     Procedures: No procedures performed   Clinical Data: No additional findings.   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 She has been retired since May 2022 and feels better now. Less stress over all. No bowel or bladder difficulty. No physical labor. No EMGs but notices that if she doesn't flex elbow up there is less numbness and tingling into the ulnar digits.    Review of Systems  Constitutional: Negative.   HENT: Negative.    Eyes: Negative.   Respiratory: Negative.    Cardiovascular: Negative.   Gastrointestinal: Negative.   Endocrine: Negative.   Genitourinary: Negative.   Musculoskeletal: Negative.   Skin: Negative.   Allergic/Immunologic: Negative.   Neurological: Negative.   Hematological: Negative.    Psychiatric/Behavioral: Negative.      Objective: Vital Signs: BP 122/79 (BP Location: Right Arm, Patient Position: Sitting, Cuff Size: Normal)   Pulse 79   Ht 5\' 4"  (1.626 m)   Wt 161 lb (73 kg)   BMI 27.64 kg/m   Physical Exam Constitutional:      Appearance: She is well-developed.  HENT:     Head: Normocephalic and atraumatic.  Eyes:     Pupils: Pupils are equal, round, and reactive to light.  Pulmonary:     Effort: Pulmonary effort is normal.     Breath sounds: Normal breath sounds.  Abdominal:     General: Bowel sounds are normal.     Palpations: Abdomen is soft.  Musculoskeletal:     Cervical back: Normal range of motion and neck supple.     Lumbar back: Negative right straight leg raise test and negative left straight leg raise test.  Skin:    General: Skin is warm and dry.  Neurological:     Mental Status: She is alert and oriented to person, place, and time.  Psychiatric:        Behavior: Behavior normal.        Thought Content: Thought content normal.        Judgment: Judgment normal.    Back Exam   Tenderness  The patient is experiencing tenderness in the cervical.  Range of Motion  Extension:  abnormal  Flexion:  abnormal  Lateral bend right:  abnormal  Lateral bend left:  abnormal  Rotation right:  abnormal  Rotation left:  abnormal   Muscle Strength  Right Quadriceps:  5/5  Left Quadriceps:  5/5  Right Hamstrings:  5/5  Left Hamstrings:  5/5   Tests  Straight leg raise right: negative Straight leg raise left: negative  Other  Toe walk: normal Heel walk: normal Erythema: no back redness Scars: absent  Comments:  Ulnar nerve at the elbow negative tinnels.     Specialty Comments:  No specialty comments available.  Imaging: No results found.   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    Post PTCA 07/06/2019   Uncontrolled type 2 diabetes mellitus with hyperglycemia (HCC) 06/27/2019   PAD (peripheral artery disease) (HCC) 06/27/2019   Coronary artery disease of native artery of native heart with stable angina pectoris (HCC) 06/27/2019   Tobacco abuse 02/08/2019   Abnormal peripheral pulse 02/08/2019   Mixed hyperglyceridemia 02/08/2019   Stable angina (HCC) 02/08/2019   Palpitations 02/08/2019   Precordial pain 02/08/2019   Past Medical History:  Diagnosis Date   Diabetes mellitus without complication (HCC)    Hyperlipidemia    Hypertension     Family History  Problem Relation Age of Onset   Hypertension Mother    Stroke Mother    Alzheimer's disease Mother    Heart attack Father    Pneumonia Father    CAD Sister    Hypertension Sister    Hypertension Brother    Heart failure Brother    Hypertension Sister    Hypertension Sister    Hypertension Sister    Hypertension Brother    Heart failure Brother    Hypertension Brother     Past Surgical History:  Procedure Laterality Date   CATARACT EXTRACTION, BILATERAL     CORONARY STENT INTERVENTION N/A 07/06/2019   Procedure: CORONARY STENT INTERVENTION;  Surgeon: Elder Negus, MD;  Location: [facility];  Service: Cardiovascular;  Laterality: N/A;  RCA prox   CORONARY STENT INTERVENTION N/A 07/25/2019   Procedure: CORONARY STENT INTERVENTION;  Surgeon: Yates Decamp, MD;  Location: [facility];  Service: Cardiovascular;  Laterality: N/A;   INTRAVASCULAR ULTRASOUND/IVUS N/A 07/06/2019   Procedure: Intravascular Ultrasound/IVUS;  Surgeon: Elder Negus, MD;  Location: [facility];  Service: Cardiovascular;  Laterality: N/A;   LEFT HEART CATH AND CORONARY ANGIOGRAPHY N/A 07/06/2019   Procedure: LEFT HEART CATH AND CORONARY ANGIOGRAPHY;  Surgeon: Elder Negus, MD;  Location: [facility];  Service: Cardiovascular;  Laterality: N/A;   TONSILLECTOMY     TUBAL LIGATION      Social History   Occupational History   Not on file  Tobacco Use   Smoking status: Former    Packs/day: 0.25    Years: 45.00    Pack years: 11.25    Types: Cigarettes    Quit date: 2021    Years since quitting: 1.7   Smokeless tobacco: Never   Tobacco comments:    Patient is dow to once cigarette a day  Vaping Use   Vaping Use: Never used  Substance and Sexual Activity   Alcohol use: Not Currently   Drug use: Never   Sexual activity: Not  on file

## 2020-12-11 ENCOUNTER — Telehealth: Payer: Self-pay | Admitting: Physical Medicine and Rehabilitation

## 2020-12-11 NOTE — Telephone Encounter (Signed)
Pt was returning shenas call. Will be back home around 4

## 2020-12-25 ENCOUNTER — Other Ambulatory Visit: Payer: Self-pay

## 2020-12-25 ENCOUNTER — Ambulatory Visit (INDEPENDENT_AMBULATORY_CARE_PROVIDER_SITE_OTHER): Payer: BC Managed Care – PPO | Admitting: Physical Medicine and Rehabilitation

## 2020-12-25 ENCOUNTER — Encounter: Payer: Self-pay | Admitting: Physical Medicine and Rehabilitation

## 2020-12-25 DIAGNOSIS — R202 Paresthesia of skin: Secondary | ICD-10-CM

## 2020-12-25 NOTE — Progress Notes (Signed)
Numbness in fourth and fifth fingers of both hands.  Right hand dominant No lotion per patient

## 2020-12-27 NOTE — Progress Notes (Signed)
[name] - 65 y.o. female MRN [id number]  Date of birth: Mar 19, 1955  Office Visit Note: Visit Date: 12/25/2020 PCP: Ralene Ok, MD Referred by: Ralene Ok, MD  Subjective: Chief Complaint  Patient presents with   Right Hand - Numbness   Left Hand - Numbness   HPI:  Chloe Garza is a 65 y.o. female who comes in today at the request of Dr. Vira Browns for electrodiagnostic study of the Bilateral upper extremities.  Patient is Right hand dominant.  She reports biggest issue is pain numbness and tingling with referral into the fourth and fifth digits of both hands pretty equally.  She denies any frank radicular symptoms really from the neck down.  Worsening with movement of the shoulders at times.  She does have a history of type 2 diabetes but with no history of polyneuropathy.  No history of prior cervical surgery.    Prior electrodiagnostic study by myself in this office on Jul 19, 2020 of this year at the request of Dr. Vira Browns once again for possible cubital tunnel syndrome versus C8 or T1 radiculopathy.  This electrodiagnostic study was normal.  Electrodiagnostic study requested again for similar complaints.  Doubtful that the ulnar nerve issue would go from normal and may too significant at this point but we will double check and recheck today.  ROS Otherwise per HPI.  Assessment & Plan: Visit Diagnoses:    ICD-10-CM   1. Paresthesia of skin  R20.2 NCV with EMG (electromyography)      Plan: Impression: Essentially NORMAL electrodiagnostic study of both upper limbs.  There is no significant electrodiagnostic evidence of nerve entrapment, brachial plexopathy or cervical radiculopathy.    As you know, purely sensory or demyelinating radiculopathies and chemical radiculitis may not be detected with this particular electrodiagnostic study. **This electrodiagnostic study cannot rule out small fiber polyneuropathy and dysesthesias from central pain syndromes such as  stroke or central pain sensitization syndromes such as fibromyalgia.  Myotomal referral pain from trigger points is also not excluded.    This represents the same result as the prior study in May.  Recommendations: 1.  Follow-up with referring physician. 2.  Continue current management of symptoms.  Meds & Orders: No orders of the defined types were placed in this encounter.   Orders Placed This Encounter  Procedures   NCV with EMG (electromyography)    Follow-up: Return in about 2 weeks (around 01/08/2021) for Vira Browns, MD.   Procedures: No procedures performed  EMG & NCV Findings: All nerve conduction studies (as indicated in the following tables) were within normal limits.  Left vs. Right side comparison data for the ulnar motor nerve indicates abnormal L-R amplitude difference (37.6 %) and abnormal L-R velocity difference (A Elbow-B Elbow, 19 m/s).  All remaining left vs. right side differences were within normal limits.    All examined muscles (as indicated in the following table) showed no evidence of electrical instability.    Impression: Essentially NORMAL electrodiagnostic study of both upper limbs.  There is no significant electrodiagnostic evidence of nerve entrapment, brachial plexopathy or cervical radiculopathy.    As you know, purely sensory or demyelinating radiculopathies and chemical radiculitis may not be detected with this particular electrodiagnostic study. **This electrodiagnostic study cannot rule out small fiber polyneuropathy and dysesthesias from central pain syndromes such as stroke or central pain sensitization syndromes such as fibromyalgia.  Myotomal referral pain from trigger points is also not excluded.    This represents the  same result as the prior study in May.  Recommendations: 1.  Follow-up with referring physician. 2.  Continue current management of symptoms.  ___________________________ [name] FAAPMR Board Certified, American Board of  Physical Medicine and Rehabilitation    Nerve Conduction Studies Anti Sensory Summary Table   Stim Site NR Peak (ms) Norm Peak (ms) P-T Amp (V) Norm P-T Amp Site1 Site2 Delta-P (ms) Dist (cm) Vel (m/s) Norm Vel (m/s)  Left Median Acr Palm Anti Sensory (2nd Digit)  31.8C  Wrist    2.8 <3.6 32.4 >10 Wrist Palm 1.3 0.0    Palm    1.5 <2.0 45.0         Right Median Acr Palm Anti Sensory (2nd Digit)  31.2C  Wrist    3.1 <3.6 31.2 >10 Wrist Palm 1.4 0.0    Palm    1.7 <2.0 37.6         Left Radial Anti Sensory (Base 1st Digit)  31.5C  Wrist    2.2 <3.1 39.3  Wrist Base 1st Digit 2.2 0.0    Right Radial Anti Sensory (Base 1st Digit)  31.7C  Wrist    2.0 <3.1 23.2  Wrist Base 1st Digit 2.0 0.0    Left Ulnar Anti Sensory (5th Digit)  32C  Wrist    3.2 <3.7 37.8 >15.0 Wrist 5th Digit 3.2 14.0 44 >38  Right Ulnar Anti Sensory (5th Digit)  31.6C  Wrist    3.0 <3.7 23.1 >15.0 Wrist 5th Digit 3.0 14.0 47 >38   Motor Summary Table   Stim Site NR Onset (ms) Norm Onset (ms) O-P Amp (mV) Norm O-P Amp Site1 Site2 Delta-0 (ms) Dist (cm) Vel (m/s) Norm Vel (m/s)  Left Median Motor (Abd Poll Brev)  31.8C  Wrist    2.7 <4.2 7.6 >5 Elbow Wrist 3.7 20.0 54 >50  Elbow    6.4  7.3         Right Median Motor (Abd Poll Brev)  31.9C  Wrist    2.7 <4.2 7.7 >5 Elbow Wrist 3.9 20.0 51 >50  Elbow    6.6  6.1         Left Ulnar Motor (Abd Dig Min)  31.6C  Wrist    2.9 <4.2 5.3 >3 B Elbow Wrist 3.0 18.0 60 >53  B Elbow    5.9  6.1  A Elbow B Elbow 1.4 10.0 71 >53  A Elbow    7.3  2.7         Right Ulnar Motor (Abd Dig Min)  32C  Wrist    2.5 <4.2 8.5 >3 B Elbow Wrist 3.0 18.0 60 >53  B Elbow    5.5  7.5  A Elbow B Elbow 1.0 9.0 90 >53  A Elbow    6.5  6.7          EMG   Side Muscle Nerve Root Ins Act Fibs Psw Amp Dur Poly Recrt Int Dennie Bible Comment  Right Abd Poll Brev Median C8-T1 Nml Nml Nml Nml Nml 0 Nml Nml   Right 1stDorInt Ulnar C8-T1 Nml Nml Nml Nml Nml 0 Nml Nml   Right PronatorTeres Median  C6-7 Nml Nml Nml Nml Nml 0 Nml Nml   Right Biceps Musculocut C5-6 Nml Nml Nml Nml Nml 0 Nml Nml   Right Deltoid Axillary C5-6 Nml Nml Nml Nml Nml 0 Nml Nml     Nerve Conduction Studies Anti Sensory Left/Right Comparison   Stim Site L Lat (ms) R  Lat (ms) L-R Lat (ms) L Amp (V) R Amp (V) L-R Amp (%) Site1 Site2 L Vel (m/s) R Vel (m/s) L-R Vel (m/s)  Median Acr Palm Anti Sensory (2nd Digit)  31.8C  Wrist 2.8 3.1 0.3 32.4 31.2 3.7 Wrist Palm     Palm 1.5 1.7 0.2 45.0 37.6 16.4       Radial Anti Sensory (Base 1st Digit)  31.5C  Wrist 2.2 2.0 0.2 39.3 23.2 41.0 Wrist Base 1st Digit     Ulnar Anti Sensory (5th Digit)  32C  Wrist 3.2 3.0 0.2 37.8 23.1 38.9 Wrist 5th Digit 44 47 3   Motor Left/Right Comparison   Stim Site L Lat (ms) R Lat (ms) L-R Lat (ms) L Amp (mV) R Amp (mV) L-R Amp (%) Site1 Site2 L Vel (m/s) R Vel (m/s) L-R Vel (m/s)  Median Motor (Abd Poll Brev)  31.8C  Wrist 2.7 2.7 0.0 7.6 7.7 1.3 Elbow Wrist 54 51 3  Elbow 6.4 6.6 0.2 7.3 6.1 16.4       Ulnar Motor (Abd Dig Min)  31.6C  Wrist 2.9 2.5 0.4 5.3 8.5 *37.6 B Elbow Wrist 60 60 0  B Elbow 5.9 5.5 0.4 6.1 7.5 18.7 A Elbow B Elbow 71 90 *19  A Elbow 7.3 6.5 0.8 2.7 6.7 59.7          Waveforms:                     Clinical History: 07/19/20 Electrodiagnostic study of both upper limbs  Impression: Essentially NORMAL electrodiagnostic study of both upper limbs.  There is no significant electrodiagnostic evidence of nerve entrapment, brachial plexopathy, cervical radiculopathy or generalized peripheral neuropathy.     As you know, purely sensory or demyelinating radiculopathies and chemical radiculitis may not be detected with this particular electrodiagnostic study. **This electrodiagnostic study cannot rule out small fiber polyneuropathy and dysesthesias from central pain syndromes such as stroke or central pain sensitization syndromes such as fibromyalgia.  Myotomal referral pain from trigger points is also  not excluded.   Recommendations: 1.  Follow-up with referring physician. 2.  Continue current management of symptoms.   ___________________________ Elease Hashimoto Board Certified, American Board of Physical Medicine and Rehabilitation     Objective:  VS:  HT:    WT:   BMI:     BP:   HR: bpm  TEMP: ( )  RESP:  Physical Exam Musculoskeletal:        General: No swelling, tenderness or deformity.     Comments: Inspection reveals no atrophy of the bilateral APB or FDI or hand intrinsics. There is no swelling, color changes, allodynia or dystrophic changes. There is 5 out of 5 strength in the bilateral wrist extension, finger abduction and long finger flexion. There is intact sensation to light touch in all dermatomal and peripheral nerve distributions. There is a negative Froment's test bilaterally. There is a negative Hoffmann's test bilaterally.  Skin:    General: Skin is warm and dry.     Findings: No erythema or rash.  Neurological:     General: No focal deficit present.     Mental Status: She is alert and oriented to person, place, and time.     Motor: No weakness or abnormal muscle tone.     Coordination: Coordination normal.  Psychiatric:        Mood and Affect: Mood normal.        Behavior: Behavior normal.  Imaging: No results found.

## 2020-12-27 NOTE — Procedures (Signed)
EMG & NCV Findings: All nerve conduction studies (as indicated in the following tables) were within normal limits.  Left vs. Right side comparison data for the ulnar motor nerve indicates abnormal L-R amplitude difference (37.6 %) and abnormal L-R velocity difference (A Elbow-B Elbow, 19 m/s).  All remaining left vs. right side differences were within normal limits.    All examined muscles (as indicated in the following table) showed no evidence of electrical instability.    Impression: Essentially NORMAL electrodiagnostic study of both upper limbs.  There is no significant electrodiagnostic evidence of nerve entrapment, brachial plexopathy or cervical radiculopathy.    As you know, purely sensory or demyelinating radiculopathies and chemical radiculitis may not be detected with this particular electrodiagnostic study. **This electrodiagnostic study cannot rule out small fiber polyneuropathy and dysesthesias from central pain syndromes such as stroke or central pain sensitization syndromes such as fibromyalgia.  Myotomal referral pain from trigger points is also not excluded.    This represents the same result as the prior study in May.  Recommendations: 1.  Follow-up with referring physician. 2.  Continue current management of symptoms.  ___________________________ Chloe Garza FAAPMR Board Certified, American Board of Physical Medicine and Rehabilitation    Nerve Conduction Studies Anti Sensory Summary Table   Stim Site NR Peak (ms) Norm Peak (ms) P-T Amp (V) Norm P-T Amp Site1 Site2 Delta-P (ms) Dist (cm) Vel (m/s) Norm Vel (m/s)  Left Median Acr Palm Anti Sensory (2nd Digit)  31.8C  Wrist    2.8 <3.6 32.4 >10 Wrist Palm 1.3 0.0    Palm    1.5 <2.0 45.0         Right Median Acr Palm Anti Sensory (2nd Digit)  31.2C  Wrist    3.1 <3.6 31.2 >10 Wrist Palm 1.4 0.0    Palm    1.7 <2.0 37.6         Left Radial Anti Sensory (Base 1st Digit)  31.5C  Wrist    2.2 <3.1 39.3  Wrist Base  1st Digit 2.2 0.0    Right Radial Anti Sensory (Base 1st Digit)  31.7C  Wrist    2.0 <3.1 23.2  Wrist Base 1st Digit 2.0 0.0    Left Ulnar Anti Sensory (5th Digit)  32C  Wrist    3.2 <3.7 37.8 >15.0 Wrist 5th Digit 3.2 14.0 44 >38  Right Ulnar Anti Sensory (5th Digit)  31.6C  Wrist    3.0 <3.7 23.1 >15.0 Wrist 5th Digit 3.0 14.0 47 >38   Motor Summary Table   Stim Site NR Onset (ms) Norm Onset (ms) O-P Amp (mV) Norm O-P Amp Site1 Site2 Delta-0 (ms) Dist (cm) Vel (m/s) Norm Vel (m/s)  Left Median Motor (Abd Poll Brev)  31.8C  Wrist    2.7 <4.2 7.6 >5 Elbow Wrist 3.7 20.0 54 >50  Elbow    6.4  7.3         Right Median Motor (Abd Poll Brev)  31.9C  Wrist    2.7 <4.2 7.7 >5 Elbow Wrist 3.9 20.0 51 >50  Elbow    6.6  6.1         Left Ulnar Motor (Abd Dig Min)  31.6C  Wrist    2.9 <4.2 5.3 >3 B Elbow Wrist 3.0 18.0 60 >53  B Elbow    5.9  6.1  A Elbow B Elbow 1.4 10.0 71 >53  A Elbow    7.3  2.7  Right Ulnar Motor (Abd Dig Min)  32C  Wrist    2.5 <4.2 8.5 >3 B Elbow Wrist 3.0 18.0 60 >53  B Elbow    5.5  7.5  A Elbow B Elbow 1.0 9.0 90 >53  A Elbow    6.5  6.7          EMG   Side Muscle Nerve Root Ins Act Fibs Psw Amp Dur Poly Recrt Int Dennie Bible Comment  Right Abd Poll Brev Median C8-T1 Nml Nml Nml Nml Nml 0 Nml Nml   Right 1stDorInt Ulnar C8-T1 Nml Nml Nml Nml Nml 0 Nml Nml   Right PronatorTeres Median C6-7 Nml Nml Nml Nml Nml 0 Nml Nml   Right Biceps Musculocut C5-6 Nml Nml Nml Nml Nml 0 Nml Nml   Right Deltoid Axillary C5-6 Nml Nml Nml Nml Nml 0 Nml Nml     Nerve Conduction Studies Anti Sensory Left/Right Comparison   Stim Site L Lat (ms) R Lat (ms) L-R Lat (ms) L Amp (V) R Amp (V) L-R Amp (%) Site1 Site2 L Vel (m/s) R Vel (m/s) L-R Vel (m/s)  Median Acr Palm Anti Sensory (2nd Digit)  31.8C  Wrist 2.8 3.1 0.3 32.4 31.2 3.7 Wrist Palm     Palm 1.5 1.7 0.2 45.0 37.6 16.4       Radial Anti Sensory (Base 1st Digit)  31.5C  Wrist 2.2 2.0 0.2 39.3 23.2 41.0 Wrist Base  1st Digit     Ulnar Anti Sensory (5th Digit)  32C  Wrist 3.2 3.0 0.2 37.8 23.1 38.9 Wrist 5th Digit 44 47 3   Motor Left/Right Comparison   Stim Site L Lat (ms) R Lat (ms) L-R Lat (ms) L Amp (mV) R Amp (mV) L-R Amp (%) Site1 Site2 L Vel (m/s) R Vel (m/s) L-R Vel (m/s)  Median Motor (Abd Poll Brev)  31.8C  Wrist 2.7 2.7 0.0 7.6 7.7 1.3 Elbow Wrist 54 51 3  Elbow 6.4 6.6 0.2 7.3 6.1 16.4       Ulnar Motor (Abd Dig Min)  31.6C  Wrist 2.9 2.5 0.4 5.3 8.5 *37.6 B Elbow Wrist 60 60 0  B Elbow 5.9 5.5 0.4 6.1 7.5 18.7 A Elbow B Elbow 71 90 *19  A Elbow 7.3 6.5 0.8 2.7 6.7 59.7          Waveforms:

## 2021-01-23 ENCOUNTER — Ambulatory Visit (INDEPENDENT_AMBULATORY_CARE_PROVIDER_SITE_OTHER): Payer: BC Managed Care – PPO | Admitting: Specialist

## 2021-01-23 ENCOUNTER — Other Ambulatory Visit: Payer: Self-pay

## 2021-01-23 ENCOUNTER — Encounter: Payer: Self-pay | Admitting: Specialist

## 2021-01-23 VITALS — BP 117/75 | HR 68 | Ht 64.0 in | Wt 161.0 lb

## 2021-01-23 DIAGNOSIS — M542 Cervicalgia: Secondary | ICD-10-CM | POA: Diagnosis not present

## 2021-01-23 DIAGNOSIS — M4722 Other spondylosis with radiculopathy, cervical region: Secondary | ICD-10-CM

## 2021-01-23 DIAGNOSIS — M778 Other enthesopathies, not elsewhere classified: Secondary | ICD-10-CM

## 2021-01-23 DIAGNOSIS — M7542 Impingement syndrome of left shoulder: Secondary | ICD-10-CM | POA: Diagnosis not present

## 2021-01-23 NOTE — Progress Notes (Signed)
Office Visit Note   Patient: Chloe Garza           Date of Birth: 05/18/1955           MRN: [id number] Visit Date: 01/23/2021              Requested by: Ralene Ok, MD [address] [place],  [place] [address] PCP: Ralene Ok, MD   Assessment & Plan: Visit Diagnoses:  1. Other spondylosis with radiculopathy, cervical region   2. Shoulder tendonitis, left   3. Impingement syndrome of left shoulder   4. Cervicalgia     Plan: Avoid overhead lifting and overhead use of the arms. Pillows to keep from sleeping directly on the shoulders Limited lifting to less than 10 lbs. Ice or heat for relief. NSAIDs are helpful, such as alleve or motrin, be careful not to use in excess as they place burdens on the kidney. Stretching exercise help and strengthening is helpful to build endurance.  Referral to [facility] PT for neck and left shoulder.  Follow-Up Instructions: No follow-ups on file.   Orders:  No orders of the defined types were placed in this encounter.  No orders of the defined types were placed in this encounter.     Procedures: No procedures performed   Clinical Data: No additional findings.   Subjective: Chief Complaint  Patient presents with  . Neck - Follow-up    EMG/NCS Review of Both Arms    65 year old female with history of left shouder and left arm symptoms underwent  left shoulder G-H injection with good relief. Now with left neck pain and some left scapula pain. EMG/NCV are negative for cervical radiculopathy or CTs or Cubital tunnel syndrome  Review of Systems   Objective: Vital Signs: BP 117/75 (BP Location: Left Arm, Patient Position: Sitting)   Pulse 68   Ht 5\' 4"  (1.626 m)   Wt 161 lb (73 kg)   BMI 27.64 kg/m   Physical Exam  Ortho Exam  Specialty Comments:  No specialty comments available.  Imaging: No results found.   PMFS History: Patient Active Problem List   Diagnosis Date Noted  . Lightheadedness 10/05/2019   . Gastroesophageal reflux disease 09/13/2019  . 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 Post PTCA 07/06/2019  . Uncontrolled type 2 diabetes mellitus with hyperglycemia (HCC) 06/27/2019  . PAD (peripheral artery disease) (HCC) 06/27/2019  . Coronary artery disease of native artery of native heart with stable angina pectoris (HCC) 06/27/2019  . Tobacco abuse 02/08/2019  . Abnormal peripheral pulse 02/08/2019  . Mixed hyperglyceridemia 02/08/2019  . Stable angina (HCC) 02/08/2019  . Palpitations 02/08/2019  . Precordial pain 02/08/2019   Past Medical History:  Diagnosis Date  . Diabetes mellitus without complication (HCC)   . Hyperlipidemia   . Hypertension     Family History  Problem Relation Age of Onset  . Hypertension Mother   . Stroke Mother   . Alzheimer's disease Mother   . Heart attack Father   . Pneumonia Father   . CAD Sister   . Hypertension Sister   . Hypertension Brother   . Heart failure Brother   . Hypertension Sister   . Hypertension Sister   . Hypertension Sister   . Hypertension Brother   . Heart failure Brother   . Hypertension Brother     Past Surgical History:  Procedure Laterality Date  . CATARACT EXTRACTION, BILATERAL    . CORONARY STENT INTERVENTION N/A 07/06/2019   Procedure: CORONARY  STENT INTERVENTION;  Surgeon: Elder Negus, MD;  Location: [facility] IN[facility]IVE [facility];  Service: Cardiovascular;  Laterality: N/A;  RCA prox  . CORONARY STENT INTERVENTION N/A 07/25/2019   Procedure: CORONARY STENT INTERVENTION;  Surgeon: Yates Decamp, MD;  Location: [facility]AS[facility] CV [facility];  Service: Cardiovascular;  Laterality: N/A;  . INTRAVASCULAR ULTRASOUND/IVUS N/A 07/06/2019   Procedure: Intravascular Ultrasound/IVUS;  Surgeon: Elder Negus, MD;  Location: [facility];  Service: Cardiovascular;  Laterality: N/A;  . LEFT HEART CATH AND CORONARY ANGIOGRAPHY N/A 07/06/2019   Procedure: LEFT HEART CATH AND CORONARY ANGIOGRAPHY;  Surgeon: Elder Negus, MD;  Location: [facility];  Service: Cardiovascular;  Laterality: N/A;  . TONSILLECTOMY    . TUBAL LIGATION     Social History   Occupational History  . Not on file  Tobacco Use  . Smoking status: Former    Packs/day: 0.25    Years: 45.00    Pack years: 11.25    Types: Cigarettes    Quit date: 2021    Years since quitting: 1.8  . Smokeless tobacco: Never  . Tobacco comments:    Patient is dow to once cigarette a day  Vaping Use  . Vaping Use: Never used  Substance and Sexual Activity  . Alcohol use: Not Currently  . Drug use: Never  . Sexual activity: Not on file

## 2021-01-23 NOTE — Patient Instructions (Signed)
Plan: Avoid overhead lifting and overhead use of the arms. Pillows to keep from sleeping directly on the shoulders Limited lifting to less than 10 lbs. Ice or heat for relief. NSAIDs are helpful, such as alleve or motrin, be careful not to use in excess as they place burdens on the kidney. Stretching exercise help and strengthening is helpful to build endurance.  Referral to [facility] PT for neck and left shoulder.

## 2021-01-27 ENCOUNTER — Other Ambulatory Visit: Payer: Self-pay | Admitting: Cardiology

## 2021-01-27 DIAGNOSIS — E783 Hyperchylomicronemia: Secondary | ICD-10-CM

## 2021-01-30 ENCOUNTER — Ambulatory Visit: Payer: BC Managed Care – PPO | Admitting: Physical Therapy

## 2021-03-02 ENCOUNTER — Other Ambulatory Visit: Payer: Self-pay | Admitting: Cardiology

## 2021-03-02 DIAGNOSIS — I25118 Atherosclerotic heart disease of native coronary artery with other forms of angina pectoris: Secondary | ICD-10-CM

## 2021-03-02 DIAGNOSIS — Q245 Malformation of coronary vessels: Secondary | ICD-10-CM

## 2021-03-20 ENCOUNTER — Other Ambulatory Visit: Payer: Self-pay | Admitting: Cardiology

## 2021-03-20 DIAGNOSIS — I25118 Atherosclerotic heart disease of native coronary artery with other forms of angina pectoris: Secondary | ICD-10-CM

## 2021-03-20 DIAGNOSIS — Q245 Malformation of coronary vessels: Secondary | ICD-10-CM

## 2021-04-25 ENCOUNTER — Ambulatory Visit: Payer: BC Managed Care – PPO | Admitting: Cardiology

## 2021-05-01 ENCOUNTER — Ambulatory Visit: Payer: BC Managed Care – PPO | Admitting: Cardiology

## 2021-05-08 ENCOUNTER — Other Ambulatory Visit: Payer: Self-pay

## 2021-05-08 ENCOUNTER — Encounter: Payer: Self-pay | Admitting: Cardiology

## 2021-05-08 ENCOUNTER — Ambulatory Visit: Payer: BC Managed Care – PPO | Admitting: Cardiology

## 2021-05-08 VITALS — BP 120/67 | HR 72 | Temp 97.6°F | Ht 64.0 in | Wt 159.0 lb

## 2021-05-08 DIAGNOSIS — I739 Peripheral vascular disease, unspecified: Secondary | ICD-10-CM

## 2021-05-08 DIAGNOSIS — I25118 Atherosclerotic heart disease of native coronary artery with other forms of angina pectoris: Secondary | ICD-10-CM

## 2021-05-08 DIAGNOSIS — Q245 Malformation of coronary vessels: Secondary | ICD-10-CM

## 2021-05-08 MED ORDER — ATORVASTATIN CALCIUM 20 MG PO TABS: 20.0000 mg | ORAL_TABLET | Freq: Every day | ORAL | 3 refills | Status: DC

## 2021-05-08 NOTE — Progress Notes (Signed)
Follow up visit  Subjective:   Chloe Garza, female    DOB: March 28, 1955, 66 y.o.   MRN: [id number]    HPI   66 y.o. Caucasian female with type 2 DM, prior tobacco dependence, CAD, myocardial bridge.  Patient is now retired. She is enjoying her hobbies such as crocheting. She is trying to walk more. Chest pain is occasional. She has had pain in her heels, but not much in her calves or thighs. She stopped taking Crestor due to myalgias.   Current Outpatient Medications on File Prior to Visit  Medication Sig Dispense Refill   acetaminophen (TYLENOL) 650 MG CR tablet Take 650-1,300 mg by mouth every 8 (eight) hours as needed for pain.     amLODipine (NORVASC) 10 MG tablet TAKE 1 TABLET(10 MG) BY MOUTH DAILY 90 tablet 0   BD PEN NEEDLE NANO 2ND GEN 32G X 4 MM MISC INJECT EVERY DAY WITH INSULIN TOUJEO     Bioflavonoid Products (VITAMIN C) CHEW Chew 1 tablet by mouth daily.     cilostazol (PLETAL) 50 MG tablet TAKE 1 TABLET(50 MG) BY MOUTH TWICE DAILY 180 tablet 3   clopidogrel (PLAVIX) 75 MG tablet TAKE 1 TABLET BY MOUTH EVERY DAY 90 tablet 2   Insulin Glargine, 2 Unit Dial, (TOUJEO MAX SOLOSTAR) 300 UNIT/ML SOPN Inject 20 Units into the skin daily.     metoprolol tartrate (LOPRESSOR) 50 MG tablet TAKE 1 TABLET(50 MG) BY MOUTH TWICE DAILY 180 tablet 2   nitroGLYCERIN (NITROSTAT) 0.4 MG SL tablet DISSOLVE 1 TABLET UNDER THE TONGUE EVERY 5 MINUTES AS NEEDED FOR UP TO 25 DAYS FOR CHEST PAIN, IF NO RELIEF IN 15 MINUTES, CALL 911 25 tablet 3   pantoprazole (PROTONIX) 20 MG tablet TAKE 1 TABLET(20 MG) BY MOUTH DAILY 90 tablet 3   Potassium 99 MG TABS Take 99 mg by mouth daily.      rosuvastatin (CRESTOR) 10 MG tablet TAKE 1 TABLET(10 MG) BY MOUTH AT BEDTIME 90 tablet 2   Turmeric (QC TUMERIC COMPLEX PO) Take 1 capsule by mouth daily.      Vitamin D, Cholecalciferol, 10 MCG (400 UNIT) CHEW Chew 400 Units by mouth daily.      ZINC OXIDE PO Take 1 tablet by mouth daily.     No current  facility-administered medications on file prior to visit.    Cardiovascular & other pertient studies:  EKG 05/08/2021: Sinus rhythm 67 bpm Anterolateral T wave inversion, consider ischemia No significant change compared to previous EKG  Coronary intervention  07/25/19: (Dr. Einar Gip) Successful PTCA and stenting of the proximal LAD with implantation of a 3.5 x 24 mm Synergy DES.  Stenosis reduced from 80% to 0% with TIMI-3 to TIMI-3 flow.  100 mL contrast utilized. Recommendation: Patient has significant intramyocardial bridging.  Very complex lesion, will place the patient under observation for tonight and discharged home in the morning.  Coronary intervention 07/06/2019: LM: Normal LAD: Tortuous vessel. Mid 80% stenosis, followed by a prominent myocardial bridge LCx: Normal Ramus: Normal RCA: Prox severe 80% stenosis        Mid 40% calcific disease  Intravascular ultrasound (IVUS) Successful percutaneous coronary intervention prox RCA     PTCA and stent placement Synergy DES 3.5 X 20 mm      Post dilatation with 4.0X15 mm Pen Mar balloon Unsuccessful Mynx closure Rt CFA requiring Femostop placement   Emergency re-look coronary angiography showed no acute vessel closure, patent RCA stent. Successful Perclosre closure Lt CFA  LVEDP 15 mmHg LVEF >65%   Long term monitor 12 days 10/05/2019 - 10/17/2019: Dominant rhythm: Sinus. HR 48-158 bpm. Avg HR 59 bpm. 2 episodes of SVT, fastest at 113 bpm for 4 beats, longest for 7 beats at 91 bpm. 1% SVE burden. 1 episode of 4 beat NSVT <1% VE burden. No atrial fibrillation/atrial flutter/SVT/VT/high grade AV block, sinus pause >3sec noted. 0 patient triggered events.    Echocardiogram 02/17/2019:  Left ventricle cavity is normal in size. Mild concentric hypertrophy of  the left ventricle. Normal LV systolic function with EF 55%. Normal global  wall motion. Doppler evidence of grade I (impaired) diastolic dysfunction,  normal LAP.  No  significant valvular abnormality.  No evidence of pulmonary hypertension.   ABI 02/17/2019: This exam reveals normal perfusion of the right lower extremity (ABI). This exam reveals normal perfusion of the left lower extremity (ABI).   Mildly abnormal biphasic waveform at the level of bilateral ankles.    Recent labs: 02/21/2020: Glucose 165, BUN/Cr 9/0.59. EGFR normal. Na/K 136/4.3. Rest of the CMP normal H/H 15/45. MCV 88. Platelets 222 HbA1C N/A Chol 141, TG 336, HDL 33, LDL 68 TSH 2.1 normal  07/26/2019: Glucose 128, BUN/Cr 12/0.85. EGFR 60. Na/K 139/4. Ca 8.6. Rest of the CMP normal H/H 11.5/34.5. MCV 91.7. Platelets 383.  01/20/2019: Glucose 217.  BUN/creatinine 11/0.64.  eGFR normal.  Sodium 137, potassium 3.9.  Rest of the CMP normal. H/H 14/42.  MCV 87.  Platelets 240. Cholesterol 218, triglycerides 293, HDL 44, LDL 114 Hemoglobin A1c 12.1%. ANA screen positive.   Review of Systems  Cardiovascular:  Positive for claudication (Atypical). Negative for chest pain, dyspnea on exertion, leg swelling, palpitations and syncope.         Vitals:   05/08/21 1440  BP: 120/67  Pulse: 72  Temp: 97.6 F (36.4 C)  SpO2: 97%      Body mass index is 27.29 kg/m. Filed Weights   05/08/21 1440  Weight: 159 lb (72.1 kg)      Objective:  Physical Exam Vitals and nursing note reviewed.  Constitutional:      General: She is not in acute distress. Neck:     Vascular: No JVD.  Cardiovascular:     Rate and Rhythm: Normal rate and regular rhythm.     Pulses:          Femoral pulses are 1+ on the right side and 1+ on the left side.      Popliteal pulses are 0 on the right side and 0 on the left side.       Dorsalis pedis pulses are 0 on the right side and 0 on the left side.       Posterior tibial pulses are 0 on the right side and 0 on the left side.     Heart sounds: Normal heart sounds. No murmur heard. Pulmonary:     Effort: Pulmonary effort is normal.     Breath  sounds: Normal breath sounds. No wheezing or rales.  Musculoskeletal:     Right lower leg: No edema.     Left lower leg: No edema.         Assessment & Recommendations:   66 y.o. Caucasian female with type 2 DM, prior tobacco dependence, CAD, myocardial bridge.  CAD: Successful PCI to RCA and LAD (2021).  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 I reckon that this is causing some degree of resting ischemia and chest pain.  Treatment remains  primarily medical management.   Continue metoprolol tartrate 50 mg twice daily, amlodipine 10 mg daily. As discussed previously, patient does not want to consider repeat stress testing, cardiac catheterization, or any high risk surgery for her myocardial bridge related symptoms; unless she is absolutely intolerant with her symptoms.  She has stopped Crestor due to myalgias. Started Lipitor 20 mg daily. Repeat lipid panel in 3 months. Will up titrate as tolerated. If does not tolerate, will consider Repatha.   PAD: Based on her symptoms and physical exam, she likely has aortoiliac disease.  However, her symptoms are mostly feet pain at this time. Encourage ambulation. Continuee medical management.  She has not tolerated cilostazol in the past due to dizziness.  Type 2 diabetes mellitus: Continue follow-up with PCP.   F/u in 6 months  Nicoli Nardozzi Esther Hardy, MD [facility] Pager: [phone] Office: [phone]

## 2021-05-31 ENCOUNTER — Other Ambulatory Visit: Payer: Self-pay | Admitting: Cardiology

## 2021-05-31 DIAGNOSIS — K219 Gastro-esophageal reflux disease without esophagitis: Secondary | ICD-10-CM

## 2021-06-22 ENCOUNTER — Other Ambulatory Visit: Payer: Self-pay | Admitting: Cardiology

## 2021-06-22 DIAGNOSIS — Q245 Malformation of coronary vessels: Secondary | ICD-10-CM

## 2021-06-22 DIAGNOSIS — I25118 Atherosclerotic heart disease of native coronary artery with other forms of angina pectoris: Secondary | ICD-10-CM

## 2021-08-13 ENCOUNTER — Other Ambulatory Visit: Payer: Self-pay | Admitting: Internal Medicine

## 2021-08-13 DIAGNOSIS — Z87891 Personal history of nicotine dependence: Secondary | ICD-10-CM

## 2021-09-08 ENCOUNTER — Ambulatory Visit
Admission: RE | Admit: 2021-09-08 | Discharge: 2021-09-08 | Disposition: A | Discharge status: Home / Self Care | Payer: BC Managed Care – PPO | Source: Ambulatory Visit | Attending: Internal Medicine | Admitting: Internal Medicine

## 2021-09-08 DIAGNOSIS — Z87891 Personal history of nicotine dependence: Secondary | ICD-10-CM

## 2021-09-12 ENCOUNTER — Other Ambulatory Visit: Payer: Self-pay | Admitting: Internal Medicine

## 2021-09-12 DIAGNOSIS — L03122 Acute lymphangitis of left axilla: Secondary | ICD-10-CM

## 2021-09-21 ENCOUNTER — Other Ambulatory Visit: Payer: Self-pay | Admitting: Cardiology

## 2021-09-21 DIAGNOSIS — Q245 Malformation of coronary vessels: Secondary | ICD-10-CM

## 2021-09-21 DIAGNOSIS — I25118 Atherosclerotic heart disease of native coronary artery with other forms of angina pectoris: Secondary | ICD-10-CM

## 2021-09-24 ENCOUNTER — Ambulatory Visit: Payer: BC Managed Care – PPO

## 2021-09-24 ENCOUNTER — Ambulatory Visit (INDEPENDENT_AMBULATORY_CARE_PROVIDER_SITE_OTHER): Payer: BC Managed Care – PPO | Admitting: Podiatry

## 2021-09-24 DIAGNOSIS — M722 Plantar fascial fibromatosis: Secondary | ICD-10-CM | POA: Diagnosis not present

## 2021-09-30 NOTE — Progress Notes (Signed)
Subjective:  Patient ID: Chloe Garza, female    DOB: 07-22-1955,  MRN: [id number]  No chief complaint on file.   66 y.o. female presents with the above complaint.  Patient presents with complaint bilateral heel pain left is worse than right side.  Hurts in the morning time.  She is a type II diabetic she has not seen anyone else prior to seeing me it hurts with ambulation pain scale 7 out of 10.  Sharp shooting in nature.  She has not seen anyone else prior to seeing me.   Review of Systems: Negative except as noted in the HPI. Denies N/V/F/Ch.  Past Medical History:  Diagnosis Date   Diabetes mellitus without complication (HCC)    Hyperlipidemia    Hypertension     Current Outpatient Medications:    acetaminophen (TYLENOL) 650 MG CR tablet, Take 650-1,300 mg by mouth every 8 (eight) hours as needed for pain., Disp: , Rfl:    amLODipine (NORVASC) 10 MG tablet, TAKE 1 TABLET(10 MG) BY MOUTH DAILY, Disp: 90 tablet, Rfl: 0   atorvastatin (LIPITOR) 20 MG tablet, Take 1 tablet (20 mg total) by mouth daily., Disp: 60 tablet, Rfl: 3   BD PEN NEEDLE NANO 2ND GEN 32G X 4 MM MISC, INJECT EVERY DAY WITH INSULIN TOUJEO, Disp: , Rfl:    Bioflavonoid Products (VITAMIN C) CHEW, Chew 1 tablet by mouth daily., Disp: , Rfl:    cilostazol (PLETAL) 50 MG tablet, TAKE 1 TABLET(50 MG) BY MOUTH TWICE DAILY, Disp: 180 tablet, Rfl: 3   clopidogrel (PLAVIX) 75 MG tablet, TAKE 1 TABLET BY MOUTH EVERY DAY, Disp: 90 tablet, Rfl: 2   Insulin Glargine, 2 Unit Dial, (TOUJEO MAX SOLOSTAR) 300 UNIT/ML SOPN, Inject 20 Units into the skin daily., Disp: , Rfl:    metoprolol tartrate (LOPRESSOR) 50 MG tablet, TAKE 1 TABLET(50 MG) BY MOUTH TWICE DAILY, Disp: 180 tablet, Rfl: 2   nitroGLYCERIN (NITROSTAT) 0.4 MG SL tablet, DISSOLVE 1 TABLET UNDER THE TONGUE EVERY 5 MINUTES AS NEEDED FOR UP TO 25 DAYS FOR CHEST PAIN, IF NO RELIEF IN 15 MINUTES, CALL 911, Disp: 25 tablet, Rfl: 3   pantoprazole (PROTONIX) 20 MG tablet,  TAKE 1 TABLET(20 MG) BY MOUTH DAILY, Disp: 90 tablet, Rfl: 3   Potassium 99 MG TABS, Take 99 mg by mouth daily. , Disp: , Rfl:    Turmeric (QC TUMERIC COMPLEX PO), Take 1 capsule by mouth daily. , Disp: , Rfl:    vitamin B-12 (CYANOCOBALAMIN) 500 MCG tablet, Take 500 mcg by mouth daily., Disp: , Rfl:    Vitamin D, Cholecalciferol, 10 MCG (400 UNIT) CHEW, Chew 400 Units by mouth daily. , Disp: , Rfl:    ZINC OXIDE PO, Take 1 tablet by mouth daily., Disp: , Rfl:   Social History   Tobacco Use  Smoking Status Former   Packs/day: 0.25   Years: 45.00   Total pack years: 11.25   Types: Cigarettes   Quit date: 2021   Years since quitting: 2.5  Smokeless Tobacco Never  Tobacco Comments   Patient is dow to once cigarette a day    Allergies  Allergen Reactions   Sulfa Antibiotics Hives   Objective:  There were no vitals filed for this visit. There is no height or weight on file to calculate BMI. Constitutional Well developed. Well nourished.  Vascular Dorsalis pedis pulses palpable bilaterally. Posterior tibial pulses palpable bilaterally. Capillary refill normal to all digits.  No cyanosis or clubbing noted. Pedal hair  growth normal.  Neurologic Normal speech. Oriented to person, place, and time. Epicritic sensation to light touch grossly present bilaterally.  Dermatologic Nails well groomed and normal in appearance. No open wounds. No skin lesions.  Orthopedic: Normal joint ROM without pain or crepitus bilaterally. No visible deformities. Tender to palpation at the calcaneal tuber bilaterally. No pain with calcaneal squeeze bilaterally. Ankle ROM diminished range of motion bilaterally. Silfverskiold Test: positive bilaterally.   Radiographs: Taken and reviewed. No acute fractures or dislocations. No evidence of stress fracture.  Plantar heel spur present. Posterior heel spur present.   Assessment:   1. Plantar fasciitis of right foot   2. Plantar fasciitis of left foot     Plan:  Patient was evaluated and treated and all questions answered.  Plantar Fasciitis, bilaterally - XR reviewed as above.  - Educated on icing and stretching. Instructions given.  - Injection delivered to the plantar fascia as below. - DME: Plantar fascial brace dispensed to support the medial longitudinal arch of the foot and offload pressure from the heel and prevent arch collapse during weightbearing - Pharmacologic management: None  Procedure: Injection Tendon/Ligament Location: Bilateral plantar fascia at the glabrous junction; medial approach. Skin Prep: alcohol Injectate: 0.5 cc 0.5% marcaine plain, 0.5 cc of 1% Lidocaine, 0.5 cc kenalog 10. Disposition: Patient tolerated procedure well. Injection site dressed with a band-aid.  No follow-ups on file.

## 2021-10-03 ENCOUNTER — Ambulatory Visit
Admission: RE | Admit: 2021-10-03 | Discharge: 2021-10-03 | Disposition: A | Discharge status: Home / Self Care | Payer: BC Managed Care – PPO | Source: Ambulatory Visit | Attending: Internal Medicine | Admitting: Internal Medicine

## 2021-10-03 DIAGNOSIS — L03122 Acute lymphangitis of left axilla: Secondary | ICD-10-CM

## 2021-10-22 ENCOUNTER — Ambulatory Visit (INDEPENDENT_AMBULATORY_CARE_PROVIDER_SITE_OTHER): Payer: BC Managed Care – PPO | Admitting: Podiatry

## 2021-10-22 DIAGNOSIS — M7730 Calcaneal spur, unspecified foot: Secondary | ICD-10-CM

## 2021-10-22 DIAGNOSIS — M722 Plantar fascial fibromatosis: Secondary | ICD-10-CM | POA: Diagnosis not present

## 2021-10-22 NOTE — Progress Notes (Signed)
Subjective:  Patient ID: Chloe Garza, female    DOB: [birth year],  MRN: [id number]  Chief Complaint  Patient presents with   Plantar Fasciitis    66 y.o. female presents with the above complaint.  Patient presents with complaint bilateral heel pain left is worse than right side.  She states the injection helped considerably.  She would like to discuss next treatment plan.  She would like to discuss insulin management as well.   Review of Systems: Negative except as noted in the HPI. Denies N/V/F/Ch.  Past Medical History:  Diagnosis Date   Diabetes mellitus without complication (HCC)    Hyperlipidemia    Hypertension     Current Outpatient Medications:    acetaminophen (TYLENOL) 650 MG CR tablet, Take 650-1,300 mg by mouth every 8 (eight) hours as needed for pain., Disp: , Rfl:    amLODipine (NORVASC) 10 MG tablet, TAKE 1 TABLET(10 MG) BY MOUTH DAILY, Disp: 90 tablet, Rfl: 0   atorvastatin (LIPITOR) 20 MG tablet, Take 1 tablet (20 mg total) by mouth daily., Disp: 60 tablet, Rfl: 3   BD PEN NEEDLE NANO 2ND GEN 32G X 4 MM MISC, INJECT EVERY DAY WITH INSULIN TOUJEO, Disp: , Rfl:    Bioflavonoid Products (VITAMIN C) CHEW, Chew 1 tablet by mouth daily., Disp: , Rfl:    cilostazol (PLETAL) 50 MG tablet, TAKE 1 TABLET(50 MG) BY MOUTH TWICE DAILY, Disp: 180 tablet, Rfl: 3   clopidogrel (PLAVIX) 75 MG tablet, TAKE 1 TABLET BY MOUTH EVERY DAY, Disp: 90 tablet, Rfl: 2   Insulin Glargine, 2 Unit Dial, (TOUJEO MAX SOLOSTAR) 300 UNIT/ML SOPN, Inject 20 Units into the skin daily., Disp: , Rfl:    metoprolol tartrate (LOPRESSOR) 50 MG tablet, TAKE 1 TABLET(50 MG) BY MOUTH TWICE DAILY, Disp: 180 tablet, Rfl: 2   nitroGLYCERIN (NITROSTAT) 0.4 MG SL tablet, DISSOLVE 1 TABLET UNDER THE TONGUE EVERY 5 MINUTES AS NEEDED FOR UP TO 25 DAYS FOR CHEST PAIN, IF NO RELIEF IN 15 MINUTES, CALL 911, Disp: 25 tablet, Rfl: 3   pantoprazole (PROTONIX) 20 MG tablet, TAKE 1 TABLET(20 MG) BY MOUTH DAILY, Disp: 90  tablet, Rfl: 3   Potassium 99 MG TABS, Take 99 mg by mouth daily. , Disp: , Rfl:    Turmeric (QC TUMERIC COMPLEX PO), Take 1 capsule by mouth daily. , Disp: , Rfl:    vitamin B-12 (CYANOCOBALAMIN) 500 MCG tablet, Take 500 mcg by mouth daily., Disp: , Rfl:    Vitamin D, Cholecalciferol, 10 MCG (400 UNIT) CHEW, Chew 400 Units by mouth daily. , Disp: , Rfl:    ZINC OXIDE PO, Take 1 tablet by mouth daily., Disp: , Rfl:   Social History   Tobacco Use  Smoking Status Former   Packs/day: 0.25   Years: 45.00   Total pack years: 11.25   Types: Cigarettes   Quit date: 2021   Years since quitting: 2.6  Smokeless Tobacco Never  Tobacco Comments   Patient is dow to once cigarette a day    Allergies  Allergen Reactions   Sulfa Antibiotics Hives   Objective:  There were no vitals filed for this visit. There is no height or weight on file to calculate BMI. Constitutional Well developed. Well nourished.  Vascular Dorsalis pedis pulses palpable bilaterally. Posterior tibial pulses palpable bilaterally. Capillary refill normal to all digits.  No cyanosis or clubbing noted. Pedal hair growth normal.  Neurologic Normal speech. Oriented to person, place, and time. Epicritic sensation to light touch  grossly present bilaterally.  Dermatologic Nails well groomed and normal in appearance. No open wounds. No skin lesions.  Orthopedic: Normal joint ROM without pain or crepitus bilaterally. No visible deformities. Tender to palpation at the calcaneal tuber bilaterally. No pain with calcaneal squeeze bilaterally. Ankle ROM diminished range of motion bilaterally. Silfverskiold Test: positive bilaterally.   Radiographs: Taken and reviewed. No acute fractures or dislocations. No evidence of stress fracture.  Plantar heel spur present. Posterior heel spur present.   Assessment:   No diagnosis found.  Plan:  Patient was evaluated and treated and all questions answered.  Plantar Fasciitis,  bilaterally with underlying heel spur - XR reviewed as above.  - Educated on icing and stretching. Instructions given.  -Second injection delivered to the plantar fascia as below. - DME: Plantar fascial brace dispensed to support the medial longitudinal arch of the foot and offload pressure from the heel and prevent arch collapse during weightbearing - Pharmacologic management: None -I discussed power steps orthotics.  If it does not help patient will need custom-made orthotics.  Procedure: Injection Tendon/Ligament Location: Bilateral plantar fascia at the glabrous junction; medial approach. Skin Prep: alcohol Injectate: 0.5 cc 0.5% marcaine plain, 0.5 cc of 1% Lidocaine, 0.5 cc kenalog 10. Disposition: Patient tolerated procedure well. Injection site dressed with a band-aid.  No follow-ups on file.

## 2021-10-29 ENCOUNTER — Other Ambulatory Visit: Payer: Self-pay | Admitting: Cardiology

## 2021-10-29 ENCOUNTER — Encounter: Payer: Self-pay | Admitting: Podiatry

## 2021-10-29 DIAGNOSIS — E783 Hyperchylomicronemia: Secondary | ICD-10-CM

## 2021-11-07 ENCOUNTER — Ambulatory Visit: Payer: BC Managed Care – PPO | Admitting: Cardiology

## 2021-11-07 ENCOUNTER — Encounter: Payer: Self-pay | Admitting: Cardiology

## 2021-11-07 VITALS — BP 124/71 | HR 60 | Temp 98.0°F | Resp 16 | Ht 64.0 in | Wt 155.0 lb

## 2021-11-07 DIAGNOSIS — Q245 Malformation of coronary vessels: Secondary | ICD-10-CM

## 2021-11-07 DIAGNOSIS — I25118 Atherosclerotic heart disease of native coronary artery with other forms of angina pectoris: Secondary | ICD-10-CM

## 2021-11-07 DIAGNOSIS — I739 Peripheral vascular disease, unspecified: Secondary | ICD-10-CM

## 2021-11-07 NOTE — Progress Notes (Signed)
Follow up visit  Subjective:   Chloe Garza, female    DOB: [birth year], 66 y.o.   MRN: [id number]    HPI   66 y.o. Caucasian female with type 2 DM, prior tobacco dependence, CAD, myocardial bridge.  Chest pain has improved. Recent labs not available to me.    Current Outpatient Medications:    acetaminophen (TYLENOL) 650 MG CR tablet, Take 650-1,300 mg by mouth every 8 (eight) hours as needed for pain., Disp: , Rfl:    amLODipine (NORVASC) 10 MG tablet, TAKE 1 TABLET(10 MG) BY MOUTH DAILY, Disp: 90 tablet, Rfl: 0   atorvastatin (LIPITOR) 20 MG tablet, Take 1 tablet (20 mg total) by mouth daily., Disp: 60 tablet, Rfl: 3   BD PEN NEEDLE NANO 2ND GEN 32G X 4 MM MISC, INJECT EVERY DAY WITH INSULIN TOUJEO, Disp: , Rfl:    Bioflavonoid Products (VITAMIN C) CHEW, Chew 1 tablet by mouth daily., Disp: , Rfl:    cilostazol (PLETAL) 50 MG tablet, TAKE 1 TABLET(50 MG) BY MOUTH TWICE DAILY, Disp: 180 tablet, Rfl: 3   clopidogrel (PLAVIX) 75 MG tablet, TAKE 1 TABLET BY MOUTH EVERY DAY, Disp: 90 tablet, Rfl: 2   Insulin Glargine, 2 Unit Dial, (TOUJEO MAX SOLOSTAR) 300 UNIT/ML SOPN, Inject 20 Units into the skin daily., Disp: , Rfl:    metoprolol tartrate (LOPRESSOR) 50 MG tablet, TAKE 1 TABLET(50 MG) BY MOUTH TWICE DAILY, Disp: 180 tablet, Rfl: 2   nitroGLYCERIN (NITROSTAT) 0.4 MG SL tablet, DISSOLVE 1 TABLET UNDER THE TONGUE EVERY 5 MINUTES AS NEEDED FOR UP TO 25 DAYS FOR CHEST PAIN, IF NO RELIEF IN 15 MINUTES, CALL 911, Disp: 25 tablet, Rfl: 3   pantoprazole (PROTONIX) 20 MG tablet, TAKE 1 TABLET(20 MG) BY MOUTH DAILY, Disp: 90 tablet, Rfl: 3   Potassium 99 MG TABS, Take 99 mg by mouth daily. , Disp: , Rfl:    Turmeric (QC TUMERIC COMPLEX PO), Take 1 capsule by mouth daily. , Disp: , Rfl:    vitamin B-12 (CYANOCOBALAMIN) 500 MCG tablet, Take 500 mcg by mouth daily., Disp: , Rfl:    Vitamin D, Cholecalciferol, 10 MCG (400 UNIT) CHEW, Chew 400 Units by mouth daily. , Disp: , Rfl:    ZINC OXIDE  PO, Take 1 tablet by mouth daily., Disp: , Rfl:   Cardiovascular & other pertient studies:  EKG 05/08/2021: Sinus rhythm 67 bpm Anterolateral T wave inversion, consider ischemia No significant change compared to previous EKG No significant change compared to previous EKG  Coronary intervention  07/25/19: (Dr. Einar Gip) Successful PTCA and stenting of the proximal LAD with implantation of a 3.5 x 24 mm Synergy DES.  Stenosis reduced from 80% to 0% with TIMI-3 to TIMI-3 flow.  100 mL contrast utilized. Recommendation: Patient has significant intramyocardial bridging.  Very complex lesion, will place the patient under observation for tonight and discharged home in the morning.  Coronary intervention 07/06/2019: LM: Normal LAD: Tortuous vessel. Mid 80% stenosis, followed by a prominent myocardial bridge LCx: Normal Ramus: Normal RCA: Prox severe 80% stenosis        Mid 40% calcific disease  Intravascular ultrasound (IVUS) Successful percutaneous coronary intervention prox RCA     PTCA and stent placement Synergy DES 3.5 X 20 mm      Post dilatation with 4.0X15 mm [place] balloon Unsuccessful Mynx closure Rt CFA requiring Femostop placement   Emergency re-look coronary angiography showed no acute vessel closure, patent RCA stent. Successful Perclosre closure Lt CFA  LVEDP 15 mmHg LVEF >65%   Long term monitor 12 days 10/05/2019 - 10/17/2019: Dominant rhythm: Sinus. HR 48-158 bpm. Avg HR 59 bpm. 2 episodes of SVT, fastest at 113 bpm for 4 beats, longest for 7 beats at 91 bpm. 1% SVE burden. 1 episode of 4 beat NSVT <1% VE burden. No atrial fibrillation/atrial flutter/SVT/VT/high grade AV block, sinus pause >3sec noted. 0 patient triggered events.    Echocardiogram 02/17/2019:  Left ventricle cavity is normal in size. Mild concentric hypertrophy of  the left ventricle. Normal LV systolic function with EF 55%. Normal global  wall motion. Doppler evidence of grade I (impaired) diastolic  dysfunction,  normal LAP.  No significant valvular abnormality.  No evidence of pulmonary hypertension.   ABI 02/17/2019: This exam reveals normal perfusion of the right lower extremity (ABI). This exam reveals normal perfusion of the left lower extremity (ABI).   Mildly abnormal biphasic waveform at the level of bilateral ankles.    Recent labs: 02/21/2020: Glucose 165, BUN/Cr 9/0.59. EGFR normal. Na/K 136/4.3. Rest of the CMP normal H/H 15/45. MCV 88. Platelets 222 HbA1C N/A Chol 141, TG 336, HDL 33, LDL 68 TSH 2.1 normal  07/26/2019: Glucose 128, BUN/Cr 12/0.85. EGFR 60. Na/K 139/4. Ca 8.6. Rest of the CMP normal H/H 11.5/34.5. MCV 91.7. Platelets 383.  01/20/2019: Glucose 217.  BUN/creatinine 11/0.64.  eGFR normal.  Sodium 137, potassium 3.9.  Rest of the CMP normal. H/H 14/42.  MCV 87.  Platelets 240. Cholesterol 218, triglycerides 293, HDL 44, LDL 114 Hemoglobin A1c 12.1%. ANA screen positive.   Review of Systems  Cardiovascular:  Positive for claudication (Atypical). Negative for chest pain, dyspnea on exertion, leg swelling, palpitations and syncope.          Vitals:   11/07/21 1301  BP: 124/71  Pulse: 60  Resp: 16  Temp: 98 F (36.7 C)  SpO2: 96%      Body mass index is 26.61 kg/m. Filed Weights   11/07/21 1301  Weight: 155 lb (70.3 kg)      Objective:  Physical Exam Vitals and nursing note reviewed.  Constitutional:      General: She is not in acute distress. Neck:     Vascular: No JVD.  Cardiovascular:     Rate and Rhythm: Normal rate and regular rhythm.     Pulses:          Femoral pulses are 1+ on the right side and 1+ on the left side.      Popliteal pulses are 0 on the right side and 0 on the left side.       Dorsalis pedis pulses are 0 on the right side and 0 on the left side.       Posterior tibial pulses are 0 on the right side and 0 on the left side.     Heart sounds: Normal heart sounds. No murmur heard. Pulmonary:     Effort:  Pulmonary effort is normal.     Breath sounds: Normal breath sounds. No wheezing or rales.  Musculoskeletal:     Right lower leg: No edema.     Left lower leg: No edema.          Assessment & Recommendations:   66 y.o. Caucasian female with type 2 DM, prior tobacco dependence, CAD, myocardial bridge.  CAD: Successful PCI to RCA and LAD (2021).  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 I reckon that this is causing some degree of resting ischemia and  chest pain.  Treatment remains primarily medical management.   Continue metoprolol tartrate 50 mg twice daily, amlodipine 10 mg daily. As discussed previously, patient does not want to consider repeat stress testing, cardiac catheterization, or any high risk surgery for her myocardial bridge related symptoms; unless she is absolutely intolerant with her symptoms.  Currently omn Lipitor 20 mg daily. Will obtain lipid panel from PCP.  PAD: Based on her symptoms and physical exam, she likely has aortoiliac disease.  However, her symptoms are mostly feet pain at this time. Encourage ambulation. Continuee medical management.  She has not tolerated cilostazol in the past due to dizziness.  Type 2 diabetes mellitus: Continue follow-up with PCP.  F/u in 6 months  Karington Zarazua Esther Hardy, MD [facility] Pager: [phone] Office: [phone]

## 2021-11-08 ENCOUNTER — Other Ambulatory Visit: Payer: Self-pay | Admitting: Cardiology

## 2021-11-08 DIAGNOSIS — Q245 Malformation of coronary vessels: Secondary | ICD-10-CM

## 2021-11-08 DIAGNOSIS — I25118 Atherosclerotic heart disease of native coronary artery with other forms of angina pectoris: Secondary | ICD-10-CM

## 2021-12-07 IMAGING — MR MR SHOULDER*L* W/O CM
5 series · 37 of 40 positions shown · non-contrast
Comparison: None.

CLINICAL DATA: Left shoulder pain, decreased range of motion

EXAM:
MRI OF THE LEFT SHOULDER WITHOUT CONTRAST
TECHNIQUE: Multiplanar, multisequence MR imaging of the shoulder was performed.
No intravenous contrast was administered.

[Series 3: T2 fat-sat · axial · 4.0mm · 0.55mm/px · z∈[-54,+66]mm · 9 of 26 slices shown (1 of 3)]
[im 1/26]
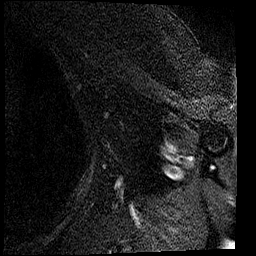
[im 4/26]
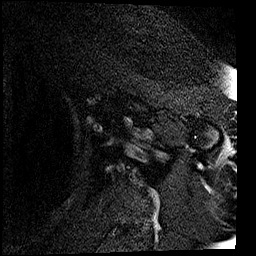
[im 7/26]
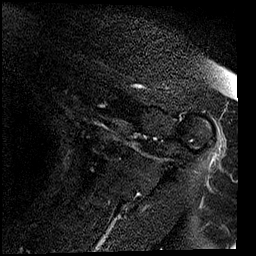
[im 10/26]
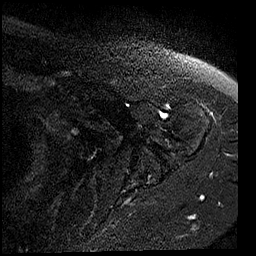
[im 13/26]
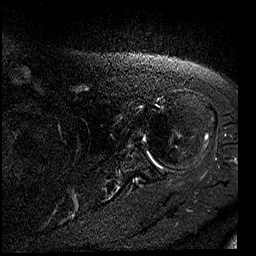
[im 16/26]
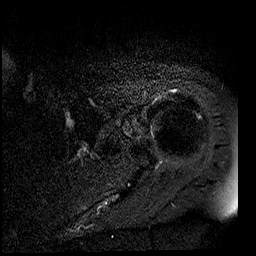
[im 19/26]
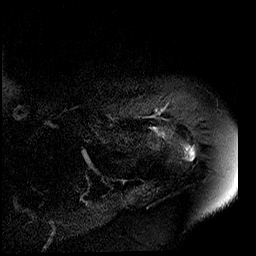
[im 22/26]
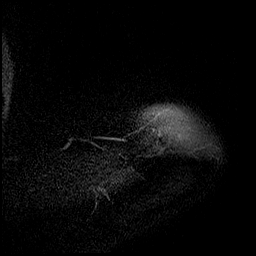
[im 26/26]
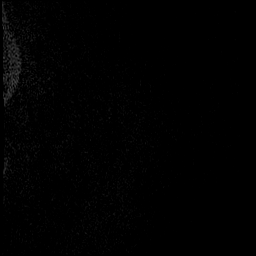

[Series 4: T2 fat-sat · oblique · 4.0mm · 0.55mm/px · 7 of 19 slices shown (2 of 3)]
[im 1/19]
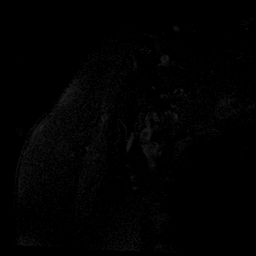
[im 4/19]
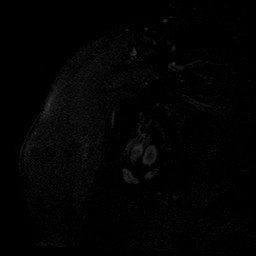
[im 7/19]
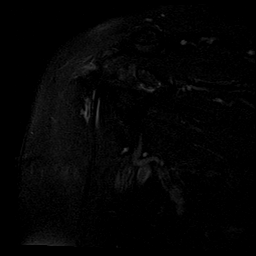
[im 10/19]
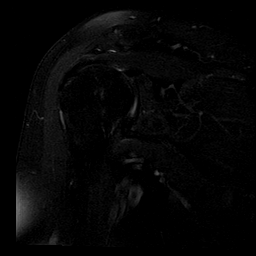
[im 13/19]
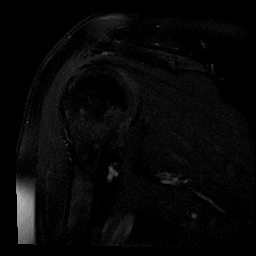
[im 16/19]
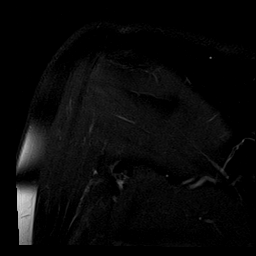
[im 19/19]
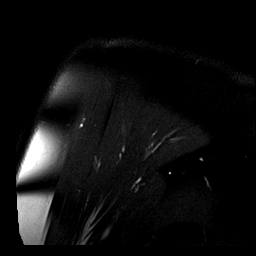

[Series 5: PD · oblique · 4.0mm · 0.27mm/px · 7 of 19 slices shown]
[im 1/19]
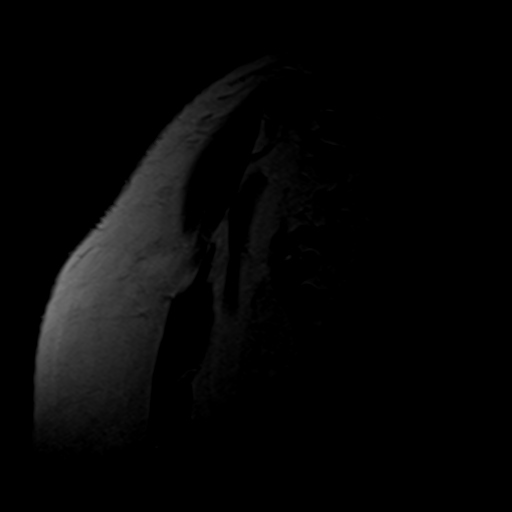
[im 4/19]
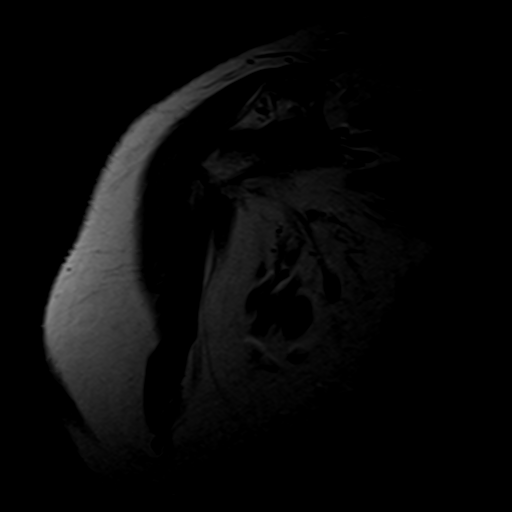
[im 7/19]
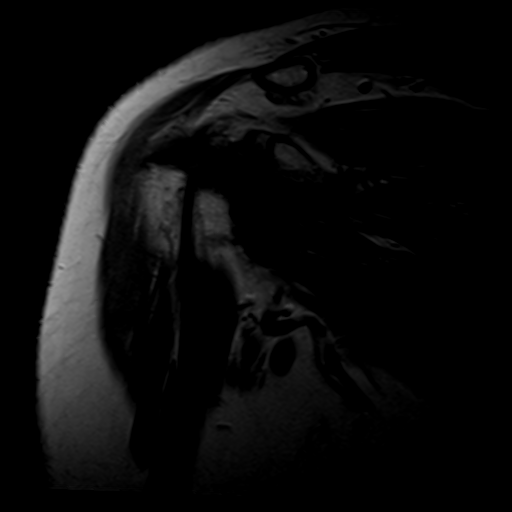
[im 10/19]
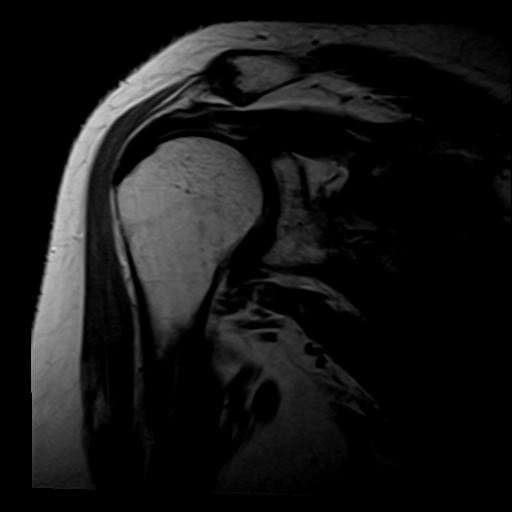
[im 13/19]
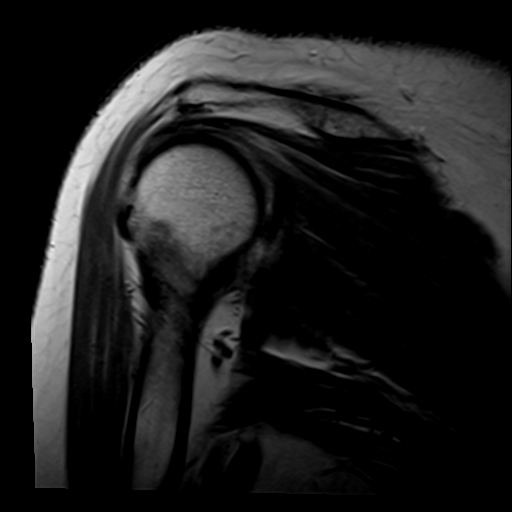
[im 16/19]
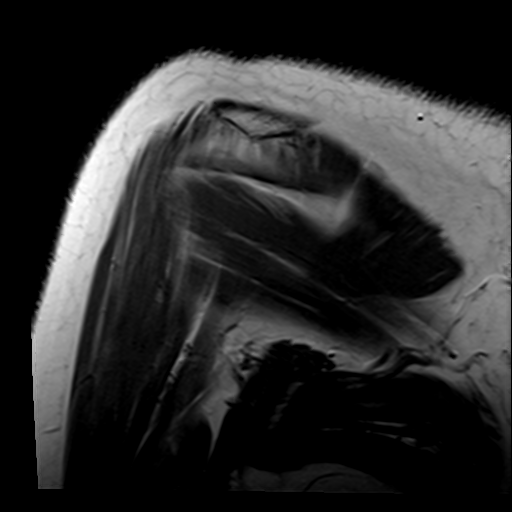
[im 19/19]
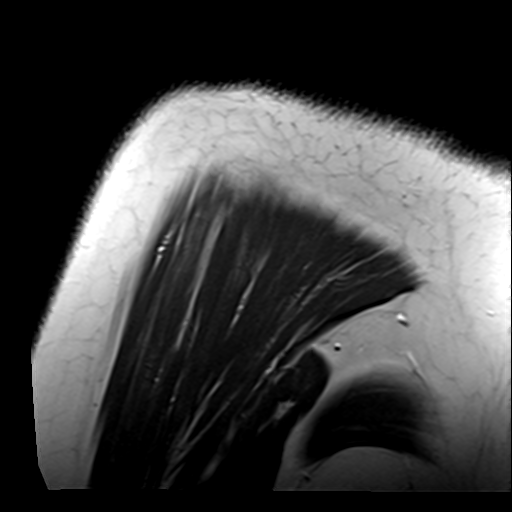

[Series 6: T2 fat-sat · oblique · 4.0mm · 0.55mm/px · 9 of 25 slices shown (3 of 3)]
[im 1/25]
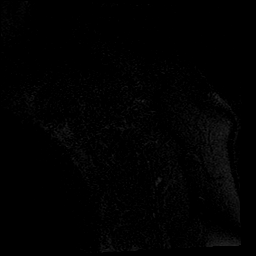
[im 4/25]
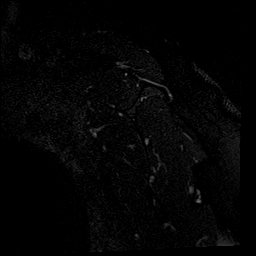
[im 7/25]
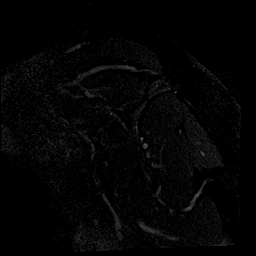
[im 10/25]
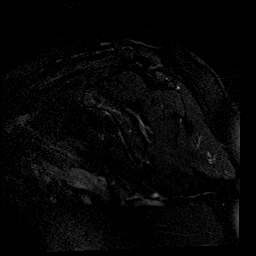
[im 13/25]
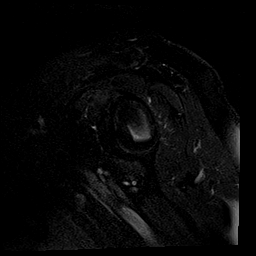
[im 16/25]
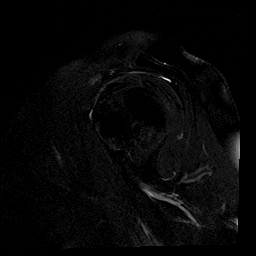
[im 19/25]
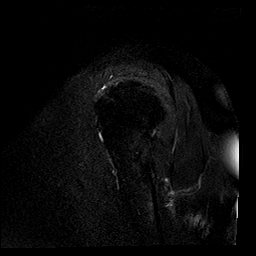
[im 22/25]
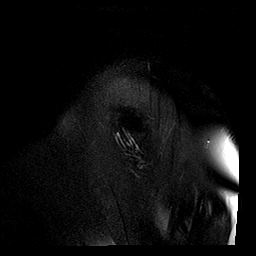
[im 25/25]
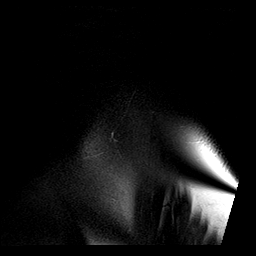

[Series 7: T1 · oblique · 4.0mm · 0.27mm/px · 5 of 23 slices shown]
[im 1/23]
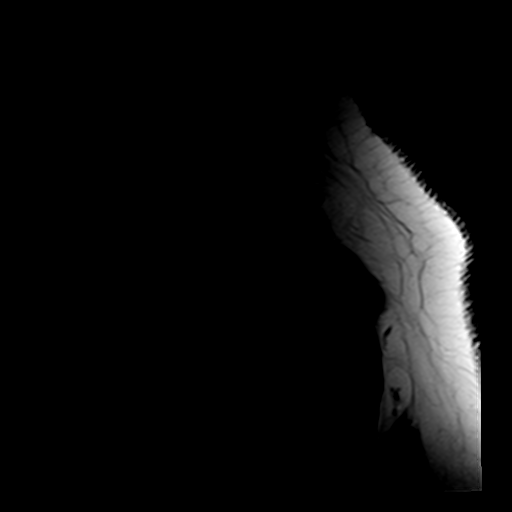
[im 4/23]
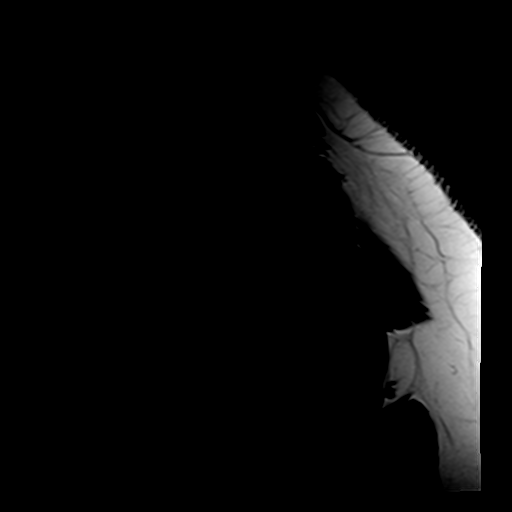
[im 7/23]
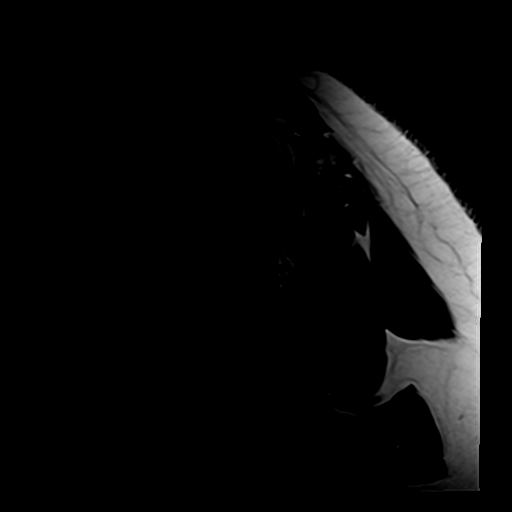
[im 10/23]
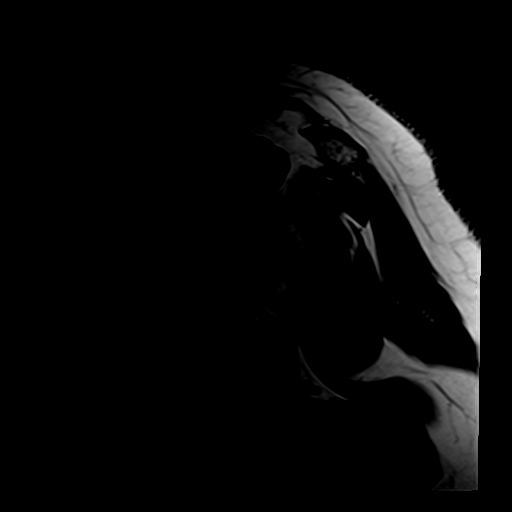
[im 13/23]
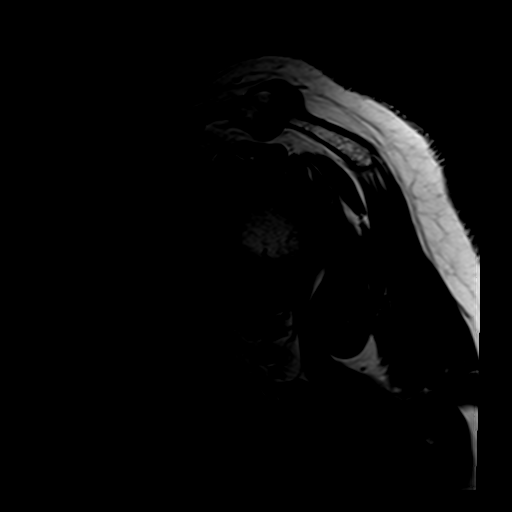

[37 of 40 positions shown; findings below may reference images not displayed]

FINDINGS: Rotator cuff: Supraspinatus tendon is intact. Mild tendinosis of the
infraspinatus tendon. Teres minor tendon is intact. Subscapularis
tendon is intact.

Muscles: No muscle atrophy or edema. No intramuscular fluid
collection or hematoma.

Biceps Long Head: Intraarticular and extraarticular portions of the
biceps tendon are intact.

Acromioclavicular Joint: Moderate arthropathy of the
acromioclavicular joint. No subacromial/subdeltoid bursal fluid.

Glenohumeral Joint: No joint effusion. No chondral defect.

Labrum: Grossly intact, but evaluation is limited by lack of
intraarticular fluid/contrast.

Bones: No fracture or dislocation. No aggressive osseous lesion.

Other: No fluid collection or hematoma.
IMPRESSION: 1. Mild tendinosis of the infraspinatus tendon.

## 2021-12-25 ENCOUNTER — Other Ambulatory Visit: Payer: Self-pay | Admitting: Cardiology

## 2021-12-25 DIAGNOSIS — I25118 Atherosclerotic heart disease of native coronary artery with other forms of angina pectoris: Secondary | ICD-10-CM

## 2021-12-25 DIAGNOSIS — Q245 Malformation of coronary vessels: Secondary | ICD-10-CM

## 2022-02-06 ENCOUNTER — Other Ambulatory Visit: Payer: Self-pay | Admitting: Cardiology

## 2022-03-28 ENCOUNTER — Other Ambulatory Visit: Payer: Self-pay | Admitting: Cardiology

## 2022-03-28 DIAGNOSIS — Q245 Malformation of coronary vessels: Secondary | ICD-10-CM

## 2022-03-28 DIAGNOSIS — I25118 Atherosclerotic heart disease of native coronary artery with other forms of angina pectoris: Secondary | ICD-10-CM

## 2022-05-07 ENCOUNTER — Other Ambulatory Visit: Payer: Self-pay | Admitting: Cardiology

## 2022-05-07 DIAGNOSIS — K219 Gastro-esophageal reflux disease without esophagitis: Secondary | ICD-10-CM

## 2022-05-08 ENCOUNTER — Ambulatory Visit: Payer: BC Managed Care – PPO | Admitting: Cardiology

## 2022-05-11 ENCOUNTER — Ambulatory Visit: Payer: BC Managed Care – PPO | Admitting: Cardiology

## 2022-06-22 ENCOUNTER — Ambulatory Visit: Payer: Medicaid Other | Admitting: Cardiology

## 2022-06-22 ENCOUNTER — Encounter: Payer: Self-pay | Admitting: Cardiology

## 2022-06-22 VITALS — BP 112/66 | HR 60 | Ht 64.0 in | Wt 147.4 lb

## 2022-06-22 DIAGNOSIS — I25118 Atherosclerotic heart disease of native coronary artery with other forms of angina pectoris: Secondary | ICD-10-CM

## 2022-06-22 DIAGNOSIS — Q245 Malformation of coronary vessels: Secondary | ICD-10-CM

## 2022-06-22 NOTE — Progress Notes (Signed)
Follow up visit  Subjective:   Chloe Garza, female    DOB: 01/29/1956, 67 y.o.   MRN: [id number]    HPI   67 y.o. Caucasian female with type 2 DM, prior tobacco dependence, CAD, myocardial bridge.  Patient's physical activity has been limited due to plantar fascitis. In spite of that. She has had worsening chest pain both at rest and exertion.  She ha snot had any recent labs checked.    Current Outpatient Medications:    acetaminophen (TYLENOL) 650 MG CR tablet, Take 650-1,300 mg by mouth every 8 (eight) hours as needed for pain., Disp: , Rfl:    amLODipine (NORVASC) 10 MG tablet, TAKE 1 TABLET(10 MG) BY MOUTH DAILY, Disp: 90 tablet, Rfl: 0   Bioflavonoid Products (VITAMIN C) CHEW, Chew 1 tablet by mouth daily., Disp: , Rfl:    clopidogrel (PLAVIX) 75 MG tablet, TAKE 1 TABLET BY MOUTH EVERY DAY, Disp: 90 tablet, Rfl: 2   Insulin Glargine, 2 Unit Dial, (TOUJEO MAX SOLOSTAR) 300 UNIT/ML SOPN, Inject 20 Units into the skin daily., Disp: , Rfl:    metoprolol tartrate (LOPRESSOR) 50 MG tablet, TAKE 1 TABLET(50 MG) BY MOUTH TWICE DAILY, Disp: 180 tablet, Rfl: 2   nitroGLYCERIN (NITROSTAT) 0.4 MG SL tablet, DISSOLVE 1 TABLET UNDER THE TONGUE EVERY 5 MINUTES AS NEEDED FOR UP TO 25 DAYS FOR CHEST PAIN, IF NO RELIEF IN 15 MINUTES, CALL 911, Disp: 25 tablet, Rfl: 3   pantoprazole (PROTONIX) 20 MG tablet, TAKE 1 TABLET(20 MG) BY MOUTH DAILY, Disp: 90 tablet, Rfl: 0   Potassium 99 MG TABS, Take 99 mg by mouth daily. , Disp: , Rfl:    vitamin B-12 (CYANOCOBALAMIN) 500 MCG tablet, Take 500 mcg by mouth daily., Disp: , Rfl:    Vitamin D, Cholecalciferol, 10 MCG (400 UNIT) CHEW, Chew 400 Units by mouth daily. , Disp: , Rfl:    ZINC OXIDE PO, Take 1 tablet by mouth daily., Disp: , Rfl:    BD PEN NEEDLE NANO 2ND GEN 32G X 4 MM MISC, INJECT EVERY DAY WITH INSULIN TOUJEO, Disp: , Rfl:   Cardiovascular & other pertient studies:  EKG 06/22/2022: Sinus rhythm 59 npm RSR(V1)  -nondiagnostic. Nonspecific ST depression  +  T-abnormality -Nondiagnostic -Possible Anterior/lateral  ischemia No hange compared to previous EKGs  Coronary intervention  07/25/19: (Dr. Jacinto HalimGanji) Successful PTCA and stenting of the proximal LAD with implantation of a 3.5 x 24 mm Synergy DES.  Stenosis reduced from 80% to 0% with TIMI-3 to TIMI-3 flow.  100 mL contrast utilized. Recommendation: Patient has significant intramyocardial bridging.  Very complex lesion, will place the patient under observation for tonight and discharged home in the morning.  Coronary intervention 07/06/2019: LM: Normal LAD: Tortuous vessel. Mid 80% stenosis, followed by a prominent myocardial bridge LCx: Normal Ramus: Normal RCA: Prox severe 80% stenosis        Mid 40% calcific disease  Intravascular ultrasound (IVUS) Successful percutaneous coronary intervention prox RCA     PTCA and stent placement Synergy DES 3.5 X 20 mm      Post dilatation with 4.0X15 mm [place] balloon Unsuccessful Mynx closure Rt CFA requiring Femostop placement   Emergency re-look coronary angiography showed no acute vessel closure, patent RCA stent. Successful Perclosre closure Lt CFA   LVEDP 15 mmHg LVEF >65%   Long term monitor 12 days 10/05/2019 - 10/17/2019: Dominant rhythm: Sinus. HR 48-158 bpm. Avg HR 59 bpm. 2 episodes of SVT, fastest at 113 bpm for  4 beats, longest for 7 beats at 91 bpm. 1% SVE burden. 1 episode of 4 beat NSVT <1% VE burden. No atrial fibrillation/atrial flutter/SVT/VT/high grade AV block, sinus pause >3sec noted. 0 patient triggered events.    Echocardiogram 02/17/2019:  Left ventricle cavity is normal in size. Mild concentric hypertrophy of  the left ventricle. Normal LV systolic function with EF 55%. Normal global  wall motion. Doppler evidence of grade I (impaired) diastolic dysfunction,  normal LAP.  No significant valvular abnormality.  No evidence of pulmonary hypertension.   ABI 02/17/2019: This  exam reveals normal perfusion of the right lower extremity (ABI). This exam reveals normal perfusion of the left lower extremity (ABI).   Mildly abnormal biphasic waveform at the level of bilateral ankles.    Recent labs: 02/21/2020: Glucose 165, BUN/Cr 9/0.59. EGFR normal. Na/K 136/4.3. Rest of the CMP normal H/H 15/45. MCV 88. Platelets 222 HbA1C N/A Chol 141, TG 336, HDL 33, LDL 68 TSH 2.1 normal  07/26/2019: Glucose 128, BUN/Cr 12/0.85. EGFR 60. Na/K 139/4. Ca 8.6. Rest of the CMP normal H/H 11.5/34.5. MCV 91.7. Platelets 383.  01/20/2019: Glucose 217.  BUN/creatinine 11/0.64.  eGFR normal.  Sodium 137, potassium 3.9.  Rest of the CMP normal. H/H 14/42.  MCV 87.  Platelets 240. Cholesterol 218, triglycerides 293, HDL 44, LDL 114 Hemoglobin A1c 12.1%. ANA screen positive.   Review of Systems  Cardiovascular:  Positive for chest pain and claudication (Mild, stable). Negative for dyspnea on exertion, leg swelling, palpitations and syncope.          Vitals:   06/22/22 1119  BP: 112/66  Pulse: 60  SpO2: 98%      Body mass index is 25.3 kg/m. Filed Weights   06/22/22 1119  Weight: 147 lb 6.4 oz (66.9 kg)      Objective:  Physical Exam Vitals and nursing note reviewed.  Constitutional:      General: She is not in acute distress. Neck:     Vascular: No JVD.  Cardiovascular:     Rate and Rhythm: Normal rate and regular rhythm.     Pulses:          Femoral pulses are 1+ on the right side and 1+ on the left side.      Popliteal pulses are 0 on the right side and 0 on the left side.       Dorsalis pedis pulses are 0 on the right side and 0 on the left side.       Posterior tibial pulses are 0 on the right side and 0 on the left side.     Heart sounds: Normal heart sounds. No murmur heard. Pulmonary:     Effort: Pulmonary effort is normal.     Breath sounds: Normal breath sounds. No wheezing or rales.  Musculoskeletal:     Right lower leg: No edema.     Left  lower leg: No edema.          Assessment & Recommendations:   67 y.o. Caucasian female with type 2 DM, prior tobacco dependence, CAD, myocardial bridge.  CAD: Successful PCI to RCA and LAD (2021).  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 I reckon that this is causing some degree of resting ischemia and chest pain.  Treatment remains primarily medical management.  Continue metoprolol tartrate 50 mg twice daily, amlodipine 10 mg daily, Lipitor 20 mg daily.   Given recent increase in chest pain symptoms, will obtain Lexiscan nuclear stress testing  and echocardiogram. Check labs, including lipid panel.   PAD: Controlled claudication. Does not want to take Pletal due to side effects.   Type 2 diabetes mellitus: Continue follow-up with PCP.  F/u in 6 months   Elder Negus, MD Pager: [phone] Office: [phone]

## 2022-06-25 LAB — CBC
Hematocrit: 40.9 % (ref 34.0–46.6)
Hemoglobin: 13.5 g/dL (ref 11.1–15.9)
MCH: 29.5 pg (ref 26.6–33.0)
MCHC: 33 g/dL (ref 31.5–35.7)
MCV: 89 fL (ref 79–97)
Platelets: 334 10*3/uL (ref 150–450)
RBC: 4.58 x10E6/uL (ref 3.77–5.28)
RDW: 13.2 % (ref 11.7–15.4)
WBC: 8.8 10*3/uL (ref 3.4–10.8)

## 2022-06-29 ENCOUNTER — Ambulatory Visit: Payer: Medicaid Other

## 2022-06-29 DIAGNOSIS — I25118 Atherosclerotic heart disease of native coronary artery with other forms of angina pectoris: Secondary | ICD-10-CM

## 2022-07-02 ENCOUNTER — Other Ambulatory Visit: Payer: Self-pay | Admitting: Cardiology

## 2022-07-02 DIAGNOSIS — Q245 Malformation of coronary vessels: Secondary | ICD-10-CM

## 2022-07-02 DIAGNOSIS — I25118 Atherosclerotic heart disease of native coronary artery with other forms of angina pectoris: Secondary | ICD-10-CM

## 2022-07-16 ENCOUNTER — Other Ambulatory Visit: Payer: Medicaid Other

## 2022-07-31 ENCOUNTER — Ambulatory Visit: Payer: Medicaid Other | Admitting: Cardiology

## 2022-08-05 ENCOUNTER — Ambulatory Visit: Payer: Medicaid Other

## 2022-08-05 ENCOUNTER — Other Ambulatory Visit: Payer: Self-pay | Admitting: Cardiology

## 2022-08-05 DIAGNOSIS — I25118 Atherosclerotic heart disease of native coronary artery with other forms of angina pectoris: Secondary | ICD-10-CM

## 2022-08-05 DIAGNOSIS — Q245 Malformation of coronary vessels: Secondary | ICD-10-CM

## 2022-08-05 DIAGNOSIS — K219 Gastro-esophageal reflux disease without esophagitis: Secondary | ICD-10-CM

## 2022-08-11 NOTE — Progress Notes (Signed)
Follow up visit  Subjective:   Chloe Garza, female    DOB: 27-Dec-1955, 67 y.o.   MRN: [id number]    HPI   67 y.o. Caucasian female with type 2 DM, prior tobacco dependence, CAD, myocardial bridge.  Patient's physical activity has been limited due to plantar fascitis. In spite of that. She has had worsening chest pain both at rest and exertion, sometimes lasting for several hours. It does get worse with physical activity and heat.    Current Outpatient Medications:    acetaminophen (TYLENOL) 650 MG CR tablet, Take 650-1,300 mg by mouth every 8 (eight) hours as needed for pain., Disp: , Rfl:    amLODipine (NORVASC) 10 MG tablet, TAKE 1 TABLET(10 MG) BY MOUTH DAILY, Disp: 90 tablet, Rfl: 0   BD PEN NEEDLE NANO 2ND GEN 32G X 4 MM MISC, INJECT EVERY DAY WITH INSULIN TOUJEO, Disp: , Rfl:    Bioflavonoid Products (VITAMIN C) CHEW, Chew 1 tablet by mouth daily., Disp: , Rfl:    clopidogrel (PLAVIX) 75 MG tablet, TAKE 1 TABLET BY MOUTH EVERY DAY, Disp: 90 tablet, Rfl: 2   Insulin Glargine, 2 Unit Dial, (TOUJEO MAX SOLOSTAR) 300 UNIT/ML SOPN, Inject 20 Units into the skin daily., Disp: , Rfl:    metoprolol tartrate (LOPRESSOR) 50 MG tablet, TAKE 1 TABLET(50 MG) BY MOUTH TWICE DAILY, Disp: 180 tablet, Rfl: 2   nitroGLYCERIN (NITROSTAT) 0.4 MG SL tablet, DISSOLVE 1 TABLET UNDER THE TONGUE EVERY 5 MINUTES AS NEEDED FOR UP TO 25 DAYS FOR CHEST PAIN, IF NO RELIEF IN 15 MINUTES, CALL 911, Disp: 25 tablet, Rfl: 3   pantoprazole (PROTONIX) 20 MG tablet, TAKE 1 TABLET(20 MG) BY MOUTH DAILY, Disp: 90 tablet, Rfl: 0   Potassium 99 MG TABS, Take 99 mg by mouth daily. , Disp: , Rfl:    vitamin B-12 (CYANOCOBALAMIN) 500 MCG tablet, Take 500 mcg by mouth daily., Disp: , Rfl:    Vitamin D, Cholecalciferol, 10 MCG (400 UNIT) CHEW, Chew 400 Units by mouth daily. , Disp: , Rfl:    ZINC OXIDE PO, Take 1 tablet by mouth daily., Disp: , Rfl:   Cardiovascular & other pertient studies:  EKG 06/22/2022: Sinus  rhythm 59 npm RSR(V1) -nondiagnostic. Nonspecific ST depression  +  T-abnormality -Nondiagnostic -Possible Anterior/lateral  ischemia No hange compared to previous EKGs  Lexiscan Sestamibi stress test 06/29/2022: Lexiscan nuclear stress test performed using 1-day protocol. Normal myocardial perfusion. Stress LVEF 67%. Low risk study.   Echocardiogram 08/05/2022: Normal LV systolic function with visual EF 55-60%. Left ventricle cavity is normal in size. Normal left ventricular wall thickness. Normal global wall motion. Normal diastolic filling pattern, normal LAP. Calculated EF 56%. Trileaflet aortic valve with no regurgitation. Mild aortic valve leaflet calcification. Trace mitral regurgitation. Mild calcification of the mitral valve annulus. Mild mitral valve leaflet thickening. Structurally normal tricuspid valve with trace regurgitation. No evidence of pulmonary hypertension. No significant change compared to 02/2019.  Coronary intervention  07/25/19: (Dr. Jacinto Halim) Successful PTCA and stenting of the proximal LAD with implantation of a 3.5 x 24 mm Synergy DES.  Stenosis reduced from 80% to 0% with TIMI-3 to TIMI-3 flow.  100 mL contrast utilized. Recommendation: Patient has significant intramyocardial bridging.  Very complex lesion, will place the patient under observation for tonight and discharged home in the morning.  Coronary intervention 07/06/2019: LM: Normal LAD: Tortuous vessel. Mid 80% stenosis, followed by a prominent myocardial bridge LCx: Normal Ramus: Normal RCA: Prox severe 80% stenosis  Mid 40% calcific disease  Intravascular ultrasound (IVUS) Successful percutaneous coronary intervention prox RCA     PTCA and stent placement Synergy DES 3.5 X 20 mm      Post dilatation with 4.0X15 mm [place] balloon Unsuccessful Mynx closure Rt CFA requiring Femostop placement   Emergency re-look coronary angiography showed no acute vessel closure, patent RCA stent. Successful  Perclosre closure Lt CFA   LVEDP 15 mmHg LVEF >65%   Long term monitor 12 days 10/05/2019 - 10/17/2019: Dominant rhythm: Sinus. HR 48-158 bpm. Avg HR 59 bpm. 2 episodes of SVT, fastest at 113 bpm for 4 beats, longest for 7 beats at 91 bpm. 1% SVE burden. 1 episode of 4 beat NSVT <1% VE burden. No atrial fibrillation/atrial flutter/SVT/VT/high grade AV block, sinus pause >3sec noted. 0 patient triggered events.    Echocardiogram 02/17/2019:  Left ventricle cavity is normal in size. Mild concentric hypertrophy of  the left ventricle. Normal LV systolic function with EF 55%. Normal global  wall motion. Doppler evidence of grade I (impaired) diastolic dysfunction,  normal LAP.  No significant valvular abnormality.  No evidence of pulmonary hypertension.   ABI 02/17/2019: This exam reveals normal perfusion of the right lower extremity (ABI). This exam reveals normal perfusion of the left lower extremity (ABI).   Mildly abnormal biphasic waveform at the level of bilateral ankles.    Recent labs: 02/21/2020: Glucose 165, BUN/Cr 9/0.59. EGFR normal. Na/K 136/4.3. Rest of the CMP normal H/H 15/45. MCV 88. Platelets 222 HbA1C N/A Chol 141, TG 336, HDL 33, LDL 68 TSH 2.1 normal  07/26/2019: Glucose 128, BUN/Cr 12/0.85. EGFR 60. Na/K 139/4. Ca 8.6. Rest of the CMP normal H/H 11.5/34.5. MCV 91.7. Platelets 383.  01/20/2019: Glucose 217.  BUN/creatinine 11/0.64.  eGFR normal.  Sodium 137, potassium 3.9.  Rest of the CMP normal. H/H 14/42.  MCV 87.  Platelets 240. Cholesterol 218, triglycerides 293, HDL 44, LDL 114 Hemoglobin A1c 12.1%. ANA screen positive.   Review of Systems  Cardiovascular:  Positive for chest pain and claudication (Mild, stable). Negative for dyspnea on exertion, leg swelling, palpitations and syncope.          There were no vitals filed for this visit.     There is no height or weight on file to calculate BMI. There were no vitals filed for this  visit.     Objective:  Physical Exam Vitals and nursing note reviewed.  Constitutional:      General: She is not in acute distress. Neck:     Vascular: No JVD.  Cardiovascular:     Rate and Rhythm: Normal rate and regular rhythm.     Pulses:          Femoral pulses are 1+ on the right side and 1+ on the left side.      Popliteal pulses are 0 on the right side and 0 on the left side.       Dorsalis pedis pulses are 0 on the right side and 0 on the left side.       Posterior tibial pulses are 0 on the right side and 0 on the left side.     Heart sounds: Normal heart sounds. No murmur heard. Pulmonary:     Effort: Pulmonary effort is normal.     Breath sounds: Normal breath sounds. No wheezing or rales.  Musculoskeletal:     Right lower leg: No edema.     Left lower leg: No edema.  Assessment & Recommendations:   67 y.o. Caucasian female with type 2 DM, prior tobacco dependence, CAD, myocardial bridge.  CAD: Successful PCI to RCA and LAD (2021).  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 I reckon that this is causing some degree of resting ischemia and chest pain. Surprisingly, there is no ischemia on stress testing (06/2022) and normal echocardiogram. However, concern remains for myocardial bridge, as well as balanced ischemia. We discussed risks, benefits, alternate options. Patient would like to proceed with coronary angiography and possible intervention. Continue metoprolol tartrate 50 mg twice daily, amlodipine 10 mg daily, Lipitor 20 mg daily.   PAD: Controlled claudication. Does not want to take Pletal due to side effects.   Type 2 diabetes mellitus: Continue follow-up with PCP.  F/u in 6 months   Elder Negus, MD Pager: [phone] Office: [phone]

## 2022-08-12 ENCOUNTER — Ambulatory Visit: Payer: Medicaid Other | Admitting: Cardiology

## 2022-08-20 ENCOUNTER — Encounter: Payer: Self-pay | Admitting: Cardiology

## 2022-08-20 ENCOUNTER — Ambulatory Visit: Payer: Medicaid Other | Admitting: Cardiology

## 2022-08-20 VITALS — BP 139/64 | HR 68 | Resp 16 | Ht 64.0 in | Wt 147.8 lb

## 2022-08-20 DIAGNOSIS — I739 Peripheral vascular disease, unspecified: Secondary | ICD-10-CM

## 2022-08-20 DIAGNOSIS — I25118 Atherosclerotic heart disease of native coronary artery with other forms of angina pectoris: Secondary | ICD-10-CM

## 2022-08-20 DIAGNOSIS — Q245 Malformation of coronary vessels: Secondary | ICD-10-CM

## 2022-10-02 ENCOUNTER — Other Ambulatory Visit: Payer: Self-pay | Admitting: Cardiology

## 2022-10-02 DIAGNOSIS — I25118 Atherosclerotic heart disease of native coronary artery with other forms of angina pectoris: Secondary | ICD-10-CM

## 2022-10-02 DIAGNOSIS — Q245 Malformation of coronary vessels: Secondary | ICD-10-CM

## 2022-11-03 ENCOUNTER — Other Ambulatory Visit: Payer: Self-pay | Admitting: Cardiology

## 2022-11-03 DIAGNOSIS — K219 Gastro-esophageal reflux disease without esophagitis: Secondary | ICD-10-CM

## 2022-11-17 ENCOUNTER — Encounter (HOSPITAL_COMMUNITY)
Admission: RE | Disposition: A | Discharge status: Home / Self Care | Payer: Self-pay | Source: Home / Self Care | Attending: Cardiology

## 2022-11-17 ENCOUNTER — Other Ambulatory Visit: Payer: Self-pay

## 2022-11-17 ENCOUNTER — Encounter (HOSPITAL_COMMUNITY): Payer: Self-pay | Admitting: Cardiology

## 2022-11-17 ENCOUNTER — Ambulatory Visit (HOSPITAL_COMMUNITY)
Admission: RE | Admit: 2022-11-17 | Discharge: 2022-11-17 | Disposition: A | Discharge status: Home / Self Care | Payer: Medicare Other | Attending: Cardiology | Admitting: Cardiology

## 2022-11-17 DIAGNOSIS — I2584 Coronary atherosclerosis due to calcified coronary lesion: Secondary | ICD-10-CM | POA: Diagnosis not present

## 2022-11-17 DIAGNOSIS — Z955 Presence of coronary angioplasty implant and graft: Secondary | ICD-10-CM | POA: Diagnosis not present

## 2022-11-17 DIAGNOSIS — Z79899 Other long term (current) drug therapy: Secondary | ICD-10-CM | POA: Insufficient documentation

## 2022-11-17 DIAGNOSIS — Z87891 Personal history of nicotine dependence: Secondary | ICD-10-CM | POA: Insufficient documentation

## 2022-11-17 DIAGNOSIS — Z794 Long term (current) use of insulin: Secondary | ICD-10-CM | POA: Diagnosis not present

## 2022-11-17 DIAGNOSIS — Q245 Malformation of coronary vessels: Secondary | ICD-10-CM | POA: Insufficient documentation

## 2022-11-17 DIAGNOSIS — E1151 Type 2 diabetes mellitus with diabetic peripheral angiopathy without gangrene: Secondary | ICD-10-CM | POA: Diagnosis not present

## 2022-11-17 DIAGNOSIS — I25118 Atherosclerotic heart disease of native coronary artery with other forms of angina pectoris: Secondary | ICD-10-CM | POA: Diagnosis not present

## 2022-11-17 HISTORY — PX: LEFT HEART CATH AND CORONARY ANGIOGRAPHY: CATH118249

## 2022-11-17 LAB — CBC
HCT: 42.2 % (ref 36.0–46.0)
Hemoglobin: 14.4 g/dL (ref 12.0–15.0)
MCH: 29.8 pg (ref 26.0–34.0)
MCHC: 34.1 g/dL (ref 30.0–36.0)
MCV: 87.2 fL (ref 80.0–100.0)
Platelets: 347 10*3/uL (ref 150–400)
RBC: 4.84 MIL/uL (ref 3.87–5.11)
RDW: 13.5 % (ref 11.5–15.5)
WBC: 11.1 10*3/uL — ABNORMAL HIGH (ref 4.0–10.5)
nRBC: 0 % (ref 0.0–0.2)

## 2022-11-17 LAB — BASIC METABOLIC PANEL
Anion gap: 13 (ref 5–15)
BUN: 11 mg/dL (ref 8–23)
CO2: 22 mmol/L (ref 22–32)
Calcium: 9 mg/dL (ref 8.9–10.3)
Chloride: 101 mmol/L (ref 98–111)
Creatinine, Ser: 0.82 mg/dL (ref 0.44–1.00)
GFR, Estimated: >60 mL/min (ref >60)
Glucose, Bld: 142 mg/dL — ABNORMAL HIGH (ref 70–99)
Potassium: 4 mmol/L (ref 3.5–5.1)
Sodium: 136 mmol/L (ref 135–145)

## 2022-11-17 LAB — GLUCOSE, CAPILLARY: Glucose-Capillary: 133 mg/dL — ABNORMAL HIGH (ref 70–99)

## 2022-11-17 SURGERY — LEFT HEART CATH AND CORONARY ANGIOGRAPHY
Anesthesia: LOCAL

## 2022-11-17 MED ORDER — SODIUM CHLORIDE 0.9 % WEIGHT BASED INFUSION
3.0000 mL/kg/h | INTRAVENOUS | Status: AC
  Administered 2022-11-17: 3 mL/kg/h via INTRAVENOUS

## 2022-11-17 MED ORDER — FENTANYL CITRATE (PF) 100 MCG/2ML IJ SOLN
INTRAMUSCULAR | Status: DC | PRN
  Administered 2022-11-17: 50 ug via INTRAVENOUS

## 2022-11-17 MED ORDER — SODIUM CHLORIDE 0.9 % WEIGHT BASED INFUSION: 1.0000 mL/kg/h | INTRAVENOUS | Status: DC

## 2022-11-17 MED ORDER — SODIUM CHLORIDE 0.9 % IV SOLN: INTRAVENOUS | Status: DC

## 2022-11-17 MED ORDER — MIDAZOLAM HCL 2 MG/2ML IJ SOLN
INTRAMUSCULAR | Status: DC | PRN
  Administered 2022-11-17: 1 mg via INTRAVENOUS

## 2022-11-17 MED ORDER — LIDOCAINE HCL (PF) 1 % IJ SOLN
INTRAMUSCULAR | Status: AC
  Filled 2022-11-17: qty 30

## 2022-11-17 MED ORDER — ONDANSETRON HCL 4 MG/2ML IJ SOLN: 4.0000 mg | Freq: Four times a day (QID) | INTRAMUSCULAR | Status: DC | PRN

## 2022-11-17 MED ORDER — IOHEXOL 350 MG/ML SOLN
INTRAVENOUS | Status: DC | PRN
  Administered 2022-11-17: 35 mL

## 2022-11-17 MED ORDER — HEPARIN (PORCINE) IN NACL 1000-0.9 UT/500ML-% IV SOLN
INTRAVENOUS | Status: DC | PRN
  Administered 2022-11-17 (×2): 500 mL

## 2022-11-17 MED ORDER — SODIUM CHLORIDE 0.9% FLUSH: 3.0000 mL | INTRAVENOUS | Status: DC | PRN

## 2022-11-17 MED ORDER — ACETAMINOPHEN 325 MG PO TABS: 650.0000 mg | ORAL_TABLET | ORAL | Status: DC | PRN

## 2022-11-17 MED ORDER — HEPARIN SODIUM (PORCINE) 1000 UNIT/ML IJ SOLN
INTRAMUSCULAR | Status: DC | PRN
  Administered 2022-11-17: 2000 [IU] via INTRAVENOUS
  Administered 2022-11-17: 1500 [IU] via INTRAVENOUS

## 2022-11-17 MED ORDER — LABETALOL HCL 5 MG/ML IV SOLN: 10.0000 mg | INTRAVENOUS | Status: DC | PRN

## 2022-11-17 MED ORDER — MIDAZOLAM HCL 2 MG/2ML IJ SOLN
INTRAMUSCULAR | Status: AC
  Filled 2022-11-17: qty 2

## 2022-11-17 MED ORDER — LIDOCAINE HCL (PF) 1 % IJ SOLN
INTRAMUSCULAR | Status: DC | PRN
  Administered 2022-11-17: 5 mL

## 2022-11-17 MED ORDER — VERAPAMIL HCL 2.5 MG/ML IV SOLN
INTRAVENOUS | Status: AC
  Filled 2022-11-17: qty 2

## 2022-11-17 MED ORDER — VERAPAMIL HCL 2.5 MG/ML IV SOLN
INTRAVENOUS | Status: DC | PRN
  Administered 2022-11-17: 10 mL via INTRA_ARTERIAL

## 2022-11-17 MED ORDER — CLOPIDOGREL BISULFATE 75 MG PO TABS: 75.0000 mg | ORAL_TABLET | ORAL | Status: DC

## 2022-11-17 MED ORDER — METOPROLOL TARTRATE 50 MG PO TABS: 75.0000 mg | ORAL_TABLET | Freq: Two times a day (BID) | ORAL | Status: DC

## 2022-11-17 MED ORDER — HYDRALAZINE HCL 20 MG/ML IJ SOLN: 10.0000 mg | INTRAMUSCULAR | Status: DC | PRN

## 2022-11-17 MED ORDER — HEPARIN SODIUM (PORCINE) 1000 UNIT/ML IJ SOLN
INTRAMUSCULAR | Status: AC
  Filled 2022-11-17: qty 10

## 2022-11-17 MED ORDER — ASPIRIN 81 MG PO CHEW
81.0000 mg | CHEWABLE_TABLET | ORAL | Status: AC
  Administered 2022-11-17: 81 mg via ORAL
  Filled 2022-11-17: qty 1

## 2022-11-17 MED ORDER — SODIUM CHLORIDE 0.9% FLUSH: 3.0000 mL | Freq: Two times a day (BID) | INTRAVENOUS | Status: DC

## 2022-11-17 MED ORDER — FENTANYL CITRATE (PF) 100 MCG/2ML IJ SOLN
INTRAMUSCULAR | Status: AC
  Filled 2022-11-17: qty 2

## 2022-11-17 MED ORDER — SODIUM CHLORIDE 0.9 % IV SOLN: 250.0000 mL | INTRAVENOUS | Status: DC | PRN

## 2022-11-17 SURGICAL SUPPLY — 11 items
CATH 5FR JL3.5 JR4 ANG PIG MP (CATHETERS) IMPLANT
DEVICE RAD COMP TR BAND LRG (VASCULAR PRODUCTS) IMPLANT
DEVICE RAD TR BAND REGULAR (VASCULAR PRODUCTS) IMPLANT
ELECT DEFIB PAD ADLT CADENCE (PAD) IMPLANT
GLIDESHEATH SLEND A-KIT 6F 22G (SHEATH) IMPLANT
GUIDEWIRE INQWIRE 1.5J.035X260 (WIRE) IMPLANT
INQWIRE 1.5J .035X260CM (WIRE) ×1
PACK CARDIAC CATHETERIZATION (CUSTOM PROCEDURE TRAY) ×1 IMPLANT
SET ATX-X65L (MISCELLANEOUS) IMPLANT
SHEATH PROBE COVER 6X72 (BAG) IMPLANT
WIRE HI TORQ VERSACORE-J 145CM (WIRE) IMPLANT

## 2022-11-17 NOTE — Discharge Instructions (Signed)

## 2022-11-17 NOTE — Interval H&P Note (Signed)
History and Physical Interval Note:  11/17/2022 10:36 AM  Chloe Garza  has presented today for surgery, with the diagnosis of cad.  The various methods of treatment have been discussed with the patient and family. After consideration of risks, benefits and other options for treatment, the patient has consented to  Procedure(s): LEFT HEART CATH AND CORONARY ANGIOGRAPHY (N/A) as a surgical intervention.  The patient's history has been reviewed, patient examined, no change in status, stable for surgery.  I have reviewed the patient's chart and labs.  Questions were answered to the patient's satisfaction.      Odessia Asleson J Ashonti Leandro

## 2022-11-17 NOTE — Interval H&P Note (Signed)
History and Physical Interval Note:  11/17/2022 1:12 PM  Chloe Garza  has presented today for surgery, with the diagnosis of cad.  The various methods of treatment have been discussed with the patient and family. After consideration of risks, benefits and other options for treatment, the patient has consented to  Procedure(s): LEFT HEART CATH AND CORONARY ANGIOGRAPHY (N/A) as a surgical intervention.  The patient's history has been reviewed, patient examined, no change in status, stable for surgery.  I have reviewed the patient's chart and labs.  Questions were answered to the patient's satisfaction.     Marilene Vath J Shaneque Merkle

## 2022-11-17 NOTE — H&P (Addendum)
Chloe Garza is an 67 y.o. female.   Chief Complaint: Chest pain HPI:   67 y.o. Caucasian female with type 2 DM, prior tobacco dependence, CAD, myocardial bridge.   Patient's physical activity has been limited due to plantar fascitis. In spite of that. She has had worsening chest pain both at rest and exertion, sometimes lasting for several hours. It does get worse with physical activity and heat.    Past Medical History:  Diagnosis Date   Diabetes mellitus without complication (HCC)    Hyperlipidemia    Hypertension     Past Surgical History:  Procedure Laterality Date   CATARACT EXTRACTION, BILATERAL     CORONARY STENT INTERVENTION N/A 07/06/2019   Procedure: CORONARY STENT INTERVENTION;  Surgeon: Elder Negus, MD;  Location: [facility];  Service: Cardiovascular;  Laterality: N/A;  RCA prox   CORONARY STENT INTERVENTION N/A 07/25/2019   Procedure: CORONARY STENT INTERVENTION;  Surgeon: Yates Decamp, MD;  Location: [facility];  Service: Cardiovascular;  Laterality: N/A;   CORONARY ULTRASOUND/IVUS N/A 07/06/2019   Procedure: Intravascular Ultrasound/IVUS;  Surgeon: Elder Negus, MD;  Location: [facility];  Service: Cardiovascular;  Laterality: N/A;   LEFT HEART CATH AND CORONARY ANGIOGRAPHY N/A 07/06/2019   Procedure: LEFT HEART CATH AND CORONARY ANGIOGRAPHY;  Surgeon: Elder Negus, MD;  Location: [facility];  Service: Cardiovascular;  Laterality: N/A;   TONSILLECTOMY     TUBAL LIGATION       Family History  Problem Relation Age of Onset   Hypertension Mother    Stroke Mother    Alzheimer's disease Mother    Heart attack Father    Pneumonia Father    CAD Sister    Hypertension Sister    Hypertension Sister    Hypertension Sister    Hypertension Sister    Hypertension Brother    Heart failure Brother    Hypertension Brother    Heart failure Brother    Hypertension Brother     Social History:  reports that she  quit smoking about 3 years ago. Her smoking use included cigarettes. She started smoking about 48 years ago. She has a 11.3 pack-year smoking history. She has never used smokeless tobacco. She reports that she does not currently use alcohol. She reports that she does not use drugs.  Allergies:  Allergies  Allergen Reactions   Sulfa Antibiotics Hives    Review of Systems  Cardiovascular:  Positive for chest pain. Negative for dyspnea on exertion, leg swelling, palpitations and syncope.     There were no vitals taken for this visit. There is no height or weight on file to calculate BMI.   Physical Exam Vitals and nursing note reviewed.  Constitutional:      General: She is not in acute distress. Neck:     Vascular: No JVD.  Cardiovascular:     Rate and Rhythm: Normal rate and regular rhythm.     Heart sounds: Normal heart sounds. No murmur heard. Pulmonary:     Effort: Pulmonary effort is normal.     Breath sounds: Normal breath sounds. No wheezing or rales.  Musculoskeletal:     Right lower leg: No edema.     Left lower leg: No edema.      Medications Prior to Admission  Medication Sig Dispense Refill   acetaminophen (TYLENOL) 650 MG CR tablet Take 1,300 mg by mouth every 8 (eight) hours as needed for pain.  amLODipine (NORVASC) 10 MG tablet TAKE 1 TABLET(10 MG) BY MOUTH DAILY 90 tablet 0   Cholecalciferol (VITAMIN D) 50 MCG (2000 UT) tablet Take 2,000 Units by mouth daily.     clopidogrel (PLAVIX) 75 MG tablet TAKE 1 TABLET BY MOUTH EVERY DAY 90 tablet 2   Insulin Glargine, 2 Unit Dial, (TOUJEO MAX SOLOSTAR) 300 UNIT/ML SOPN Inject 20-30 Units into the skin daily.     Magnesium 200 MG TABS Take 200 mg by mouth daily.     metoprolol tartrate (LOPRESSOR) 50 MG tablet TAKE 1 TABLET(50 MG) BY MOUTH TWICE DAILY 180 tablet 2   nitroGLYCERIN (NITROSTAT) 0.4 MG SL tablet DISSOLVE 1 TABLET UNDER THE TONGUE EVERY 5 MINUTES AS NEEDED FOR UP TO 25 DAYS FOR CHEST PAIN, IF NO RELIEF  IN 15 MINUTES, CALL 911 25 tablet 3   pantoprazole (PROTONIX) 20 MG tablet TAKE 1 TABLET(20 MG) BY MOUTH DAILY 90 tablet 0   Potassium 99 MG TABS Take 99 mg by mouth daily.      BD PEN NEEDLE NANO 2ND GEN 32G X 4 MM MISC INJECT EVERY DAY WITH INSULIN TOUJEO      Vitals pending  Lab Results: Reviewed and interpreted: CBC. BMP    Tests ordered: Lab Orders  No laboratory test(s) ordered today      Cardiac Studies:  EKG 06/22/2022: Sinus rhythm 59 npm RSR(V1) -nondiagnostic. Nonspecific ST depression  +  T-abnormality -Nondiagnostic -Possible Anterior/lateral  ischemia No hange compared to previous EKGs   Lexiscan Sestamibi stress test 06/29/2022: Lexiscan nuclear stress test performed using 1-day protocol. Normal myocardial perfusion. Stress LVEF 67%. Low risk study.   Echocardiogram 08/05/2022: Normal LV systolic function with visual EF 55-60%. Left ventricle cavity is normal in size. Normal left ventricular wall thickness. Normal global wall motion. Normal diastolic filling pattern, normal LAP. Calculated EF 56%. Trileaflet aortic valve with no regurgitation. Mild aortic valve leaflet calcification. Trace mitral regurgitation. Mild calcification of the mitral valve annulus. Mild mitral valve leaflet thickening. Structurally normal tricuspid valve with trace regurgitation. No evidence of pulmonary hypertension. No significant change compared to 02/2019.   Coronary intervention  07/25/19: (Dr. Jacinto Halim) Successful PTCA and stenting of the proximal LAD with implantation of a 3.5 x 24 mm Synergy DES.  Stenosis reduced from 80% to 0% with TIMI-3 to TIMI-3 flow.  100 mL contrast utilized. Recommendation: Patient has significant intramyocardial bridging.  Very complex lesion, will place the patient under observation for tonight and discharged home in the morning.   Coronary intervention 07/06/2019: LM: Normal LAD: Tortuous vessel. Mid 80% stenosis, followed by a prominent myocardial  bridge LCx: Normal Ramus: Normal RCA: Prox severe 80% stenosis        Mid 40% calcific disease  Intravascular ultrasound (IVUS) Successful percutaneous coronary intervention prox RCA     PTCA and stent placement Synergy DES 3.5 X 20 mm      Post dilatation with 4.0X15 mm [place] balloon Unsuccessful Mynx closure Rt CFA requiring Femostop placement   Emergency re-look coronary angiography showed no acute vessel closure, patent RCA stent. Successful Perclosre closure Lt CFA   LVEDP 15 mmHg LVEF >65%   Long term monitor 12 days 10/05/2019 - 10/17/2019: Dominant rhythm: Sinus. HR 48-158 bpm. Avg HR 59 bpm. 2 episodes of SVT, fastest at 113 bpm for 4 beats, longest for 7 beats at 91 bpm. 1% SVE burden. 1 episode of 4 beat NSVT <1% VE burden. No atrial fibrillation/atrial flutter/SVT/VT/high grade AV block, sinus pause >3sec noted.  0 patient triggered events.    Echocardiogram 02/17/2019:  Left ventricle cavity is normal in size. Mild concentric hypertrophy of  the left ventricle. Normal LV systolic function with EF 55%. Normal global  wall motion. Doppler evidence of grade I (impaired) diastolic dysfunction,  normal LAP.  No significant valvular abnormality.  No evidence of pulmonary hypertension.   ABI 02/17/2019: This exam reveals normal perfusion of the right lower extremity (ABI). This exam reveals normal perfusion of the left lower extremity (ABI).   Mildly abnormal biphasic waveform at the level of bilateral ankles.   Assessment & Recommendations:  67 y.o. Caucasian female with type 2 DM, prior tobacco dependence, CAD, myocardial bridge.   CAD: Successful PCI to RCA and LAD (2021).  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 I reckon that this is causing some degree of resting ischemia and chest pain. Surprisingly, there is no ischemia on stress testing (06/2022) and normal echocardiogram. However, concern remains for myocardial bridge, as well as balanced ischemia. We  discussed risks, benefits, alternate options. Patient would like to proceed with coronary angiography and possible intervention. Continue metoprolol tartrate 50 mg twice daily, amlodipine 10 mg daily, Lipitor 20 mg daily.    PAD: Controlled claudication. Does not want to take Pletal due to side effects.    Type 2 diabetes mellitus: Continue follow-up with PCP.        Elder Negus, MD Pager: [phone] Office: [phone]

## 2022-12-07 ENCOUNTER — Ambulatory Visit: Payer: Medicare Other | Admitting: Cardiology

## 2022-12-15 ENCOUNTER — Encounter: Payer: Self-pay | Admitting: Cardiology

## 2022-12-15 ENCOUNTER — Ambulatory Visit: Payer: Medicare Other | Attending: Cardiology | Admitting: Cardiology

## 2022-12-15 DIAGNOSIS — Q245 Malformation of coronary vessels: Secondary | ICD-10-CM | POA: Diagnosis not present

## 2022-12-15 DIAGNOSIS — I25118 Atherosclerotic heart disease of native coronary artery with other forms of angina pectoris: Secondary | ICD-10-CM | POA: Diagnosis not present

## 2022-12-15 MED ORDER — METOPROLOL TARTRATE 50 MG PO TABS: ORAL_TABLET | ORAL | 3 refills | Status: DC

## 2022-12-15 NOTE — Patient Instructions (Addendum)
Medication Instructions:  INCREASE METOPROLOL TO 100 MG (2 TABS) IN THE MORNING AND 75 MG ( 1 AND 1/2) IN THE EVENING  *If you need a refill on your cardiac medications before your next appointment, please call your pharmacy*   Lab Work:  If you have labs (blood work) drawn today and your tests are completely normal, you will receive your results only by: MyChart Message (if you have MyChart) OR A paper copy in the mail If you have any lab test that is abnormal or we need to change your treatment, we will call you to review the results.   Testing/Procedures:    Follow-Up: At [facility], you and your health needs are our priority.  As part of our continuing mission to provide you with exceptional heart care, we have created designated Provider Care Teams.  These Care Teams include your primary Cardiologist (physician) and Advanced Practice Providers (APPs -  Physician Assistants and Nurse Practitioners) who all work together to provide you with the care you need, when you need it.  We recommend signing up for the patient portal called "MyChart".  Sign up information is provided on this After Visit Summary.  MyChart is used to connect with patients for Virtual Visits (Telemedicine).  Patients are able to view lab/test results, encounter notes, upcoming appointments, etc.  Non-urgent messages can be sent to your provider as well.   To learn more about what you can do with MyChart, go to [email or web address].    Your next appointment:   3 month(s)  DR [facility]     Other Instructions

## 2022-12-15 NOTE — Progress Notes (Signed)
Cardiology Office Note:  .   Date:  12/15/2022  ID:  [name], DOB Jul 16, 1955, MRN [id number] PCP: Ralene Ok, MD  [facility] Providers Cardiologist:  Truett Mainland, MD PCP: Ralene Ok, MD   History of Present Illness: .    67 y.o. Caucasian female with type 2 DM, prior tobacco dependence, CAD, myocardial bridge.  Heart catheterization in 11/2022 showed patent LAD stents and myocardial bridge. She has about 3-4 episodes of chest pain, mostly at rest. She has been doing okay with walking. She had one episode of dizziness when she woke up at night.   Vitals:   12/15/22 1523  BP: 136/64  Pulse: 63  SpO2: 95%     ROS:  Review of Systems  Cardiovascular:  Positive for chest pain. Negative for dyspnea on exertion, leg swelling, palpitations and syncope.     Studies Reviewed: Marland Kitchen       Coronary angiography 11/17/2022: LM: No significant disease LAD: Patent prox LAD stent. No significant restenosis          Mid LAD long segment of myocardial bridge Lcx: OM1 tortuous with 40-50% disease, unchanged compared to prior cath Ramus: No significant disease RCA: Patent prox RCA stent. No significant restenosis          Occluded RV marginal branch, collateralized by left-to-right collaterals           Mid mild 20% disease           Distal 40% disease   Normal LVEDP, LVEF   No obstructive CAD Prominent mid LAD myocardial bridge   Recommend further optimizing beta blocker therapy   Physical Exam:   Physical Exam Vitals and nursing note reviewed.  Constitutional:      General: She is not in acute distress. Neck:     Vascular: No JVD.  Cardiovascular:     Rate and Rhythm: Normal rate and regular rhythm.     Heart sounds: Normal heart sounds. No murmur heard. Pulmonary:     Effort: Pulmonary effort is normal.     Breath sounds: Normal breath sounds. No wheezing or rales.  Musculoskeletal:     Right lower leg: No edema.     Left lower leg: No edema.       VISIT DIAGNOSES:   ICD-10-CM   1. Coronary artery disease of native artery of native heart with stable angina pectoris (HCC)  I25.118 metoprolol tartrate (LOPRESSOR) 50 MG tablet    2. 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    67 y.o. Caucasian female with type 2 DM, prior tobacco dependence, CAD, myocardial bridge.   CAD: Successful PCI to RCA and LAD (2021).  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 Continue uptitrating beta blocker therapy. Increase metoprolol tartrate to 2X50 mg in and 1.5X50 mg in PM. Increase to metoprolol succinate to 200 mg daily. Continue amlodipine 10 mg daily, Lipitor 20 mg daily.  Surgical referral for myocardial bridge would be the last option if beta blocker therapy is not adequate.   PAD: Controlled claudication. Does not want to take Pletal due to side effects.    Type 2 diabetes mellitus: Continue follow-up with PCP.    Meds ordered this encounter  Medications   metoprolol tartrate (LOPRESSOR) 50 MG tablet    Sig: TAKE 100 MG ( 2 TABS) BY MOUTH IN THE MORNING AND 75 MG (  1.5 TABS ) IN THE EVENING.    Dispense:  315 tablet    Refill:  3     F/u in 3 months  Signed, Elder Negus, MD

## 2023-01-06 ENCOUNTER — Other Ambulatory Visit: Payer: Self-pay | Admitting: Cardiology

## 2023-01-06 DIAGNOSIS — Q245 Malformation of coronary vessels: Secondary | ICD-10-CM

## 2023-01-06 DIAGNOSIS — I25118 Atherosclerotic heart disease of native coronary artery with other forms of angina pectoris: Secondary | ICD-10-CM

## 2023-01-19 ENCOUNTER — Other Ambulatory Visit: Payer: Self-pay | Admitting: Cardiology

## 2023-01-19 DIAGNOSIS — K219 Gastro-esophageal reflux disease without esophagitis: Secondary | ICD-10-CM

## 2023-03-26 ENCOUNTER — Ambulatory Visit: Payer: Medicare Other | Admitting: Cardiology

## 2023-05-27 ENCOUNTER — Ambulatory Visit: Payer: Medicare Other | Admitting: Cardiology

## 2023-05-28 ENCOUNTER — Other Ambulatory Visit: Payer: Self-pay | Admitting: Internal Medicine

## 2023-05-28 DIAGNOSIS — Z1231 Encounter for screening mammogram for malignant neoplasm of breast: Secondary | ICD-10-CM

## 2023-07-09 ENCOUNTER — Other Ambulatory Visit: Payer: Self-pay | Admitting: Cardiology

## 2023-07-22 ENCOUNTER — Encounter: Payer: Self-pay | Admitting: Cardiology

## 2023-07-22 ENCOUNTER — Ambulatory Visit: Attending: Cardiology | Admitting: Cardiology

## 2023-07-22 VITALS — BP 127/70 | HR 63 | Resp 16 | Ht 64.0 in | Wt 146.2 lb

## 2023-07-22 DIAGNOSIS — I25118 Atherosclerotic heart disease of native coronary artery with other forms of angina pectoris: Secondary | ICD-10-CM | POA: Diagnosis not present

## 2023-07-22 DIAGNOSIS — Q245 Malformation of coronary vessels: Secondary | ICD-10-CM

## 2023-07-22 DIAGNOSIS — E782 Mixed hyperlipidemia: Secondary | ICD-10-CM

## 2023-07-22 DIAGNOSIS — I739 Peripheral vascular disease, unspecified: Secondary | ICD-10-CM | POA: Diagnosis not present

## 2023-07-22 NOTE — Patient Instructions (Signed)
 Follow-Up: At [facility], you and your health needs are our priority.  As part of our continuing mission to provide you with exceptional heart care, our providers are all part of one team.  This team includes your primary Cardiologist (physician) and Advanced Practice Providers or APPs (Physician Assistants and Nurse Practitioners) who all work together to provide you with the care you need, when you need it.  Your next appointment:   1 year(s)  Provider:   Cody Das, MD

## 2023-07-22 NOTE — Progress Notes (Signed)
  Cardiology Office Note:  .   Date:  07/22/2023  ID:  Chloe Garza, DOB 01-08-1956, MRN [id number] PCP: Edda Goo, MD  [facility] Providers Cardiologist:  Fransico Ivy, MD PCP: Edda Goo, MD  Chief Complaint  Patient presents with   Coronary Artery Disease     Chloe Garza is a 68 y.o. female with type 2 DM, prior tobacco dependence, CAD, myocardial bridge.   History of Present Illness  Patient has not had no worsening of her chest pain symptoms which are occasional.  Reviewed recent lab results checked by PCP with the patient, details below.  She has stopped taking statins after what she reports as a research regarding side effects of statins.  She is not willing to start statins or any other lipid-lowering therapy at this time.    Vitals:   07/22/23 1504  BP: 127/70  Pulse: 63  Resp: 16  SpO2: 97%      Review of Systems  Cardiovascular:  Positive for chest pain. Negative for dyspnea on exertion, leg swelling, palpitations and syncope.        Studies Reviewed: Aaron Aas       Independently interpreted 05/2023: Chol 213, TG 247, HDL 50, LDL 123 HbA1C 8.5% Cr 0.8     Physical Exam Vitals and nursing note reviewed.  Constitutional:      General: She is not in acute distress. Neck:     Vascular: No JVD.  Cardiovascular:     Rate and Rhythm: Normal rate and regular rhythm.     Heart sounds: Normal heart sounds. No murmur heard. Pulmonary:     Effort: Pulmonary effort is normal.     Breath sounds: Normal breath sounds. No wheezing or rales.  Musculoskeletal:     Right lower leg: No edema.     Left lower leg: No edema.      VISIT DIAGNOSES:   ICD-10-CM   1. Coronary artery disease of native artery of native heart with stable angina pectoris (HCC)  I25.118     2. PAD (peripheral artery disease) (HCC)  I73.9     3. Myocardial bridge  Q24.5     4. Mixed hyperlipidemia  E78.2        Chloe Garza is a 68 y.o. female with  type 2 DM, prior tobacco dependence, CAD, myocardial bridge.     Assessment & Plan  CAD: Successful PCI to RCA and LAD (2021).  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 Symptoms reasonably well-controlled on metoprolol  tartrate 100 mg in a.m., and 75 mg in p.m.  In future, this could be increased to 100 mg twice daily as tolerated and needed. Continue amlodipine  10 mg daily. To my surprise, she has stopped taking statin therapy.  Lipid panel is very unfavorable.  Unfortunately, she is not willing to start statin or any other lipid-lowering therapy at this time.  Potential consequences discussed with the patient in detail.  I hope she will change her mind in the future.  PAD: Controlled claudication. Does not want to take Pletal  due to side effects.    Type 2 diabetes mellitus: A1c 8.5%.  Continue close follow-up with PCP Dr. Lajuan Pila who was managing her diabetes.        No orders of the defined types were placed in this encounter.    F/u in 1 year  Signed, Cody Das, MD

## 2023-10-02 ENCOUNTER — Other Ambulatory Visit: Payer: Self-pay | Admitting: Cardiology

## 2023-10-02 DIAGNOSIS — Q245 Malformation of coronary vessels: Secondary | ICD-10-CM

## 2023-10-02 DIAGNOSIS — I25118 Atherosclerotic heart disease of native coronary artery with other forms of angina pectoris: Secondary | ICD-10-CM

## 2023-10-02 LAB — LAB REPORT - SCANNED
Albumin, Urine POC: 0.6
Albumin/Creatinine Ratio, Urine, POC: 4
Creatinine, POC: 166 mg/dL
EGFR: 78

## 2023-10-07 ENCOUNTER — Other Ambulatory Visit: Payer: Self-pay | Admitting: Cardiology

## 2023-10-07 DIAGNOSIS — K219 Gastro-esophageal reflux disease without esophagitis: Secondary | ICD-10-CM

## 2023-10-12 ENCOUNTER — Telehealth: Payer: Self-pay | Admitting: Cardiology

## 2023-10-12 NOTE — Telephone Encounter (Signed)
*  STAT* If patient is at the pharmacy, call can be transferred to refill team.   1. Which medications need to be refilled? (please list name of each medication and dose if known) pantoprazole  (PROTONIX ) 20 MG tablet   2. Which pharmacy/location (including street and city if local pharmacy) is medication to be sent to?  [id number] - [place], [place] - [address]    3. Do they need a 30 day or 90 day supply? 90

## 2023-10-13 ENCOUNTER — Other Ambulatory Visit: Payer: Self-pay | Admitting: Cardiology

## 2023-10-13 DIAGNOSIS — K219 Gastro-esophageal reflux disease without esophagitis: Secondary | ICD-10-CM

## 2023-10-13 NOTE — Telephone Encounter (Signed)
 Left detailed message advising and advised to contact office if needed.

## 2023-10-13 NOTE — Telephone Encounter (Signed)
 I would defer to Dr. Valma, PCP.  Thanks MJP

## 2023-10-13 NOTE — Telephone Encounter (Signed)
 Are you okay with refilling? Contacted pharmacy and was advised that there are no more refills left.

## 2023-10-19 ENCOUNTER — Ambulatory Visit: Payer: Self-pay | Admitting: Cardiology

## 2023-10-19 NOTE — Progress Notes (Signed)
 Cholesterol 242, triglycerides 279, HDL 47, LDL 149 Patient is opted against being on statin as noted during office visit in 07/2023.  Strongly recommend statin given her disease, as well as diabetes.  If she has had diabetes, recommend referral to lipid clinic to consider injectable agents.  Thanks MJP

## 2023-12-31 ENCOUNTER — Other Ambulatory Visit: Payer: Self-pay | Admitting: Cardiology

## 2024-01-06 ENCOUNTER — Other Ambulatory Visit: Payer: Self-pay

## 2024-01-06 DIAGNOSIS — I25118 Atherosclerotic heart disease of native coronary artery with other forms of angina pectoris: Secondary | ICD-10-CM

## 2024-01-06 DIAGNOSIS — Q245 Malformation of coronary vessels: Secondary | ICD-10-CM

## 2024-01-10 MED ORDER — AMLODIPINE BESYLATE 10 MG PO TABS: 10.0000 mg | ORAL_TABLET | Freq: Every day | ORAL | 1 refills | Status: AC
